# Patient Record
Sex: Female | Born: 1965
Health system: Southern US, Community
[De-identification: ages and names within clinical notes are randomized; demographics above are authoritative.]

## PROBLEM LIST (undated history)

## (undated) DIAGNOSIS — R000 Tachycardia, unspecified: Secondary | ICD-10-CM

## (undated) DIAGNOSIS — F419 Anxiety disorder, unspecified: Secondary | ICD-10-CM

## (undated) DIAGNOSIS — D509 Iron deficiency anemia, unspecified: Secondary | ICD-10-CM

## (undated) DIAGNOSIS — D219 Benign neoplasm of connective and other soft tissue, unspecified: Secondary | ICD-10-CM

## (undated) DIAGNOSIS — R030 Elevated blood-pressure reading, without diagnosis of hypertension: Secondary | ICD-10-CM

## (undated) DIAGNOSIS — N920 Excessive and frequent menstruation with regular cycle: Secondary | ICD-10-CM

## (undated) DIAGNOSIS — I10 Essential (primary) hypertension: Secondary | ICD-10-CM

## (undated) DIAGNOSIS — E669 Obesity, unspecified: Secondary | ICD-10-CM

## (undated) DIAGNOSIS — IMO0001 Reserved for inherently not codable concepts without codable children: Secondary | ICD-10-CM

## (undated) DIAGNOSIS — L709 Acne, unspecified: Secondary | ICD-10-CM

## (undated) HISTORY — DX: Acne, unspecified: L70.9

## (undated) HISTORY — DX: Elevated blood-pressure reading, without diagnosis of hypertension: R03.0

## (undated) HISTORY — DX: Anxiety disorder, unspecified: F41.9

## (undated) HISTORY — DX: Reserved for inherently not codable concepts without codable children: IMO0001

## (undated) HISTORY — DX: Obesity, unspecified: E66.9

## (undated) HISTORY — DX: Essential (primary) hypertension: I10

## (undated) HISTORY — DX: Tachycardia, unspecified: R00.0

## (undated) HISTORY — PX: MYOMECTOMY: SHX85

## (undated) HISTORY — PX: OVARIAN CYST REMOVAL: SHX89

## (undated) HISTORY — DX: Benign neoplasm of connective and other soft tissue, unspecified: D21.9

## (undated) HISTORY — DX: Iron deficiency anemia, unspecified: D50.9

## (undated) HISTORY — DX: Excessive and frequent menstruation with regular cycle: N92.0

## (undated) HISTORY — PX: APPENDECTOMY: SHX54

---

## 2011-09-14 ENCOUNTER — Telehealth: Payer: Self-pay | Admitting: Family Medicine

## 2011-09-14 NOTE — Telephone Encounter (Signed)
dt ?

## 2011-09-28 ENCOUNTER — Other Ambulatory Visit (HOSPITAL_COMMUNITY)
Admission: RE | Admit: 2011-09-28 | Discharge: 2011-09-28 | Disposition: A | Discharge status: Home / Self Care | Payer: PRIVATE HEALTH INSURANCE | Source: Ambulatory Visit | Attending: Obstetrics and Gynecology | Admitting: Obstetrics and Gynecology

## 2011-09-28 ENCOUNTER — Other Ambulatory Visit: Payer: Self-pay | Admitting: Obstetrics and Gynecology

## 2011-09-28 DIAGNOSIS — Z01419 Encounter for gynecological examination (general) (routine) without abnormal findings: Secondary | ICD-10-CM | POA: Insufficient documentation

## 2011-09-28 DIAGNOSIS — N760 Acute vaginitis: Secondary | ICD-10-CM | POA: Insufficient documentation

## 2011-09-28 DIAGNOSIS — Z1159 Encounter for screening for other viral diseases: Secondary | ICD-10-CM | POA: Insufficient documentation

## 2011-09-28 DIAGNOSIS — Z1231 Encounter for screening mammogram for malignant neoplasm of breast: Secondary | ICD-10-CM

## 2011-09-29 ENCOUNTER — Telehealth: Payer: Self-pay | Admitting: *Deleted

## 2011-09-29 NOTE — Telephone Encounter (Signed)
Called and spoke with patient, Dr Cain Saupe in regards to scheduling her consultation for IV iron/anemia.  She has started oral iron and would like to try to get Wednesday afternoon appointments. Orders sent to scheduling.

## 2011-10-05 ENCOUNTER — Telehealth: Payer: Self-pay | Admitting: Oncology

## 2011-10-05 NOTE — Telephone Encounter (Signed)
Talked to pt, she is aware of appt and location 

## 2011-10-05 NOTE — Telephone Encounter (Signed)
Called pt, left message on cell regarding appt as a new patient, waiting for a call back

## 2011-10-10 ENCOUNTER — Telehealth: Payer: Self-pay | Admitting: Oncology

## 2011-10-10 NOTE — Telephone Encounter (Signed)
Referred by Dr. Geryl Rankins Dx- IDA

## 2011-10-18 ENCOUNTER — Encounter: Payer: Self-pay | Admitting: Oncology

## 2011-10-18 ENCOUNTER — Other Ambulatory Visit: Payer: Self-pay | Admitting: Oncology

## 2011-10-18 DIAGNOSIS — D509 Iron deficiency anemia, unspecified: Secondary | ICD-10-CM

## 2011-10-18 DIAGNOSIS — N920 Excessive and frequent menstruation with regular cycle: Secondary | ICD-10-CM

## 2011-10-18 DIAGNOSIS — I10 Essential (primary) hypertension: Secondary | ICD-10-CM

## 2011-10-18 HISTORY — DX: Iron deficiency anemia, unspecified: D50.9

## 2011-10-18 HISTORY — DX: Excessive and frequent menstruation with regular cycle: N92.0

## 2011-10-18 HISTORY — DX: Essential (primary) hypertension: I10

## 2011-10-19 ENCOUNTER — Ambulatory Visit (HOSPITAL_BASED_OUTPATIENT_CLINIC_OR_DEPARTMENT_OTHER): Payer: PRIVATE HEALTH INSURANCE | Admitting: Oncology

## 2011-10-19 ENCOUNTER — Ambulatory Visit: Payer: PRIVATE HEALTH INSURANCE

## 2011-10-19 ENCOUNTER — Other Ambulatory Visit (HOSPITAL_BASED_OUTPATIENT_CLINIC_OR_DEPARTMENT_OTHER): Payer: PRIVATE HEALTH INSURANCE | Admitting: Lab

## 2011-10-19 VITALS — BP 151/78 | HR 106 | Temp 97.3°F | Ht 66.0 in | Wt 292.0 lb

## 2011-10-19 DIAGNOSIS — N920 Excessive and frequent menstruation with regular cycle: Secondary | ICD-10-CM

## 2011-10-19 DIAGNOSIS — N924 Excessive bleeding in the premenopausal period: Secondary | ICD-10-CM

## 2011-10-19 DIAGNOSIS — D509 Iron deficiency anemia, unspecified: Secondary | ICD-10-CM

## 2011-10-19 DIAGNOSIS — D500 Iron deficiency anemia secondary to blood loss (chronic): Secondary | ICD-10-CM

## 2011-10-19 LAB — CHCC SMEAR

## 2011-10-19 LAB — CBC & DIFF AND RETIC
BASO%: 0.5 % (ref 0.0–2.0)
EOS%: 1.1 % (ref 0.0–7.0)
HCT: 33 % — ABNORMAL LOW (ref 34.8–46.6)
Immature Retic Fract: 20.2 % — ABNORMAL HIGH (ref 1.60–10.00)
MCH: 21.3 pg — ABNORMAL LOW (ref 25.1–34.0)
MCHC: 29.7 g/dL — ABNORMAL LOW (ref 31.5–36.0)
MONO#: 0.5 10*3/uL (ref 0.1–0.9)
NEUT%: 69.6 % (ref 38.4–76.8)
RBC: 4.61 10*6/uL (ref 3.70–5.45)
RDW: 23.1 % — ABNORMAL HIGH (ref 11.2–14.5)
Retic %: 2.64 % — ABNORMAL HIGH (ref 0.70–2.10)
WBC: 9.3 10*3/uL (ref 3.9–10.3)
lymph#: 2.2 10*3/uL (ref 0.9–3.3)

## 2011-10-19 LAB — IRON AND TIBC
TIBC: 453 ug/dL (ref 250–470)
UIBC: 433 ug/dL — ABNORMAL HIGH (ref 125–400)

## 2011-10-19 NOTE — Progress Notes (Signed)
New Patient Hematology-Oncology Evaluation   Debra Mathews [id number] Dec 21, 1965 46 y.o. 10/19/2011  CC: Dr. 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 She has been in overall good health. She has treated hypertension. She has long-standing menorrhagia. She has known uterine fibroids and uterine polyps. She had a polypectomy procedure in the past. She had a recent GYN exam and an endometrial biopsy which were normal except for a fibroid uterus. At time of her recent April 17  evaluation by Dr. Dion Body hemoglobin was 9.1 hematocrit 32 MCV 65.8. She was put back on iron supplements on May 23 with a preparation called integral plus, one daily, as well as Lysteda which is a brand name for tranexamic acid, an anti-fibrinolytic agent to use for up to 5 days at the start of her cycle.  She admits that she has not taken iron on a regular basis in the past. She has become more symptomatic with her anemia since she has gained some weight. She is getting increasing fatigue and dyspnea on mild exertion.  She is considering hormonal suppression of her menses but is concerned with the thrombotic risk.  There is no family history of any bleeding disorder. No history of sickle cell disease.   PMH: Past Medical History  Diagnosis Date  . Iron deficiency anemia 10/18/2011    Hb 9.1 MCV 65.8 08/24/11 hx menorrhagia/fibroids/nl GYN exam  . HTN (hypertension), benign 10/18/2011  . Menorrhagia with regular cycle 10/18/2011  Positive for allergic rhinitis. No asthma. No ulcers or reflux. History of hepatitis A contracted from contaminated food; she has been vaccinated twice for  hepatitis B protection; no history of thyroid trouble. No blood clots. No excessive bleeding after surgical procedures. No  history of seizure or stroke.   Past Surgical History: Exploratory surgery for abdominal pain when she was in her 49s with findings of a ruptured ovarian cyst. Incidental appendectomy done at that time.  Allergies: No allergies to medication  Medications: Loratadine 10 mg daily. In Iron with B complex vitamins 1 daily. Valsartan 80 mg daily Norvasc 5 mg daily Flexeril 5 mg 3 times a day Lexapro 10 mg daily Xanax 0.5 mg when necessary. 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 Healthy daughter age 70. No alcohol or tobacco.  Family History: Parents both still alive. Mother with hypertension. Status post resection of lung cancer. Father with diabetes. 2 brothers who are healthy one with diabetes.  Review of Systems: Constitutional symptoms: See above HEENT: Respiratory: No cough or dyspnea Cardiovascular:  No chest pain or palpitations Gastrointestinal ROS: No change in bowel habit. No hematochezia. Genito-Urinary ROS: See above. No hematuria Hematological and Lymphatic: Musculoskeletal: Neurologic: Dermatologic: Remaining ROS negative.  Physical Exam: Blood pressure 151/78, pulse 106, temperature 97.3 F (36.3 C), temperature source Oral, height 5\' 6"  (1.676 m), weight 292 lb (132.45 kg). Wt Readings from Last 3 Encounters:  10/19/11 292 lb (132.45 kg)    General appearance: Pleasant overweight African American woman Head: Normal Neck: Normal Lymph nodes: No cervical, supraclavicular, or axillary adenopathy Breasts: Not examined Lungs: Clear to auscultation resonant to percussion Heart: Regular rhythm no murmur Abdominal: Soft nontender no mass no organomegaly GU: Not examined. Recent  normal pelvic and rectal by her GYN. See discussion above. Extremities: No edema no calf tenderness. Neurologic: Mental status intact, cranial nerves intact, pupils round reactive to light optic disc sharp vessels  normal, motor strength 5 over 5, reflexes 1+ symmetric, sensation intact to vibration by tuning fork exam over the fingertips Skin: No rash or ecchymoses    Lab Results: Lab Results  Component Value Date   WBC 9.3 10/19/2011   HGB 9.8* 10/19/2011   HCT 33.0* 10/19/2011   MCV 71.6* 10/19/2011   PLT 365 10/19/2011   reticulocyte count 2.6%   iron studies pending   Chemistry   No results found for this basename: NA, K, CL, CO2, BUN, CREATININE, GLU   No results found for this basename: CALCIUM, ALKPHOS, AST, ALT, BILITOT       Review of peripheral blood film: Hypochromic microcytic red cells, 1+ he is a poikilocytosis, no polychromasia, mature neutrophils and lymphocytes, platelets normal in number and morphology    Impression and Plan: Simple iron deficiency anemia due to menorrhagia.  I don't recommend parenteral iron in healthy young adults who tolerate oral iron and don't show signs of malabsorption.  The important consideration is to stay on the oral iron on a regular basis. It typically takes about 6 weeks to show a 1 g rise in hemoglobin. She was just started back on iron at time of her recent may 22nd 2013 OB/GYN visit and is already showing a response with hemoglobin up from 9.1 to 9.8, reticulocyte count 2.6%, and MCV up from 66 to 72. She is encouraged to stay on the oral iron and increase dose to twice a day if tolerated.. My concern with IV iron other than the inconvenience and expense is the potential for anaphylactic reactions while although uncommon, still occur  She has discussed more definitive treatment of her menorrhagia either with endometrial ablation her hysterectomy with her gynecologist and is considering these options.       Levert Feinstein, MD 10/19/2011, 5:09 PM

## 2011-10-26 ENCOUNTER — Encounter: Payer: Self-pay | Admitting: Oncology

## 2011-10-26 ENCOUNTER — Ambulatory Visit
Admission: RE | Admit: 2011-10-26 | Discharge: 2011-10-26 | Disposition: A | Discharge status: Home / Self Care | Payer: PRIVATE HEALTH INSURANCE | Source: Ambulatory Visit | Attending: Obstetrics and Gynecology | Admitting: Obstetrics and Gynecology

## 2011-10-26 DIAGNOSIS — Z1231 Encounter for screening mammogram for malignant neoplasm of breast: Secondary | ICD-10-CM

## 2013-09-02 ENCOUNTER — Other Ambulatory Visit: Payer: Self-pay

## 2013-09-02 DIAGNOSIS — Z1231 Encounter for screening mammogram for malignant neoplasm of breast: Secondary | ICD-10-CM

## 2013-09-11 ENCOUNTER — Ambulatory Visit: Payer: PRIVATE HEALTH INSURANCE

## 2013-09-25 ENCOUNTER — Ambulatory Visit
Admission: RE | Admit: 2013-09-25 | Discharge: 2013-09-25 | Disposition: A | Discharge status: Home / Self Care | Payer: BC Managed Care – PPO | Source: Ambulatory Visit

## 2013-09-25 ENCOUNTER — Encounter (INDEPENDENT_AMBULATORY_CARE_PROVIDER_SITE_OTHER): Payer: Self-pay

## 2013-09-25 DIAGNOSIS — Z1231 Encounter for screening mammogram for malignant neoplasm of breast: Secondary | ICD-10-CM

## 2013-12-18 ENCOUNTER — Encounter: Payer: Self-pay | Admitting: *Deleted

## 2014-04-22 ENCOUNTER — Ambulatory Visit
Admission: RE | Admit: 2014-04-22 | Discharge: 2014-04-22 | Disposition: A | Discharge status: Home / Self Care | Payer: BC Managed Care – PPO | Source: Ambulatory Visit | Attending: Rheumatology | Admitting: Rheumatology

## 2014-04-22 ENCOUNTER — Other Ambulatory Visit: Payer: Self-pay | Admitting: Rheumatology

## 2014-04-22 DIAGNOSIS — R700 Elevated erythrocyte sedimentation rate: Secondary | ICD-10-CM

## 2015-12-14 ENCOUNTER — Other Ambulatory Visit: Payer: Self-pay | Admitting: Family Medicine

## 2015-12-14 DIAGNOSIS — R1011 Right upper quadrant pain: Secondary | ICD-10-CM

## 2015-12-23 ENCOUNTER — Inpatient Hospital Stay: Admission: RE | Admit: 2015-12-23 | Payer: Self-pay | Source: Ambulatory Visit

## 2016-03-04 ENCOUNTER — Telehealth: Payer: Self-pay | Admitting: Radiology

## 2016-03-04 ENCOUNTER — Other Ambulatory Visit: Payer: Self-pay | Admitting: Radiology

## 2016-03-04 MED ORDER — SULFASALAZINE 500 MG PO TBEC: 1000.0000 mg | DELAYED_RELEASE_TABLET | Freq: Two times a day (BID) | ORAL | 1 refills | Status: DC

## 2016-03-04 NOTE — Telephone Encounter (Signed)
Called patient to schedule December follow up and patient states she will call back to schedule because she did not know of a good time to come in, as of right now.

## 2016-03-04 NOTE — Telephone Encounter (Signed)
Patient needs follow up appt with Dr Estanislado Pandy pls call to make appt. Dec

## 2016-03-04 NOTE — Telephone Encounter (Signed)
11/18/15 last visit  Next visit not scheduled yet, have sent message to have her scheduled 12/16/15 labs WNL

## 2016-05-09 HISTORY — PX: ROOT CANAL: SHX2363

## 2016-07-12 DIAGNOSIS — E66813 Obesity, class 3: Secondary | ICD-10-CM

## 2016-07-12 HISTORY — DX: Morbid (severe) obesity due to excess calories: E66.01

## 2016-07-12 HISTORY — DX: Obesity, class 3: E66.813

## 2016-08-16 ENCOUNTER — Encounter: Payer: Self-pay | Admitting: Gastroenterology

## 2016-10-05 ENCOUNTER — Encounter: Payer: Self-pay | Admitting: Family Medicine

## 2016-10-18 ENCOUNTER — Encounter: Payer: Self-pay | Admitting: Gastroenterology

## 2017-04-27 NOTE — Progress Notes (Signed)
Office Visit Note  Patient: Debra Mathews             Date of Birth: 09-11-1965           MRN: [id number]             PCP: Maurice Small, MD Referring: No ref. provider found Visit Date: 05/03/2017 Occupation: @GUAROCC @    Subjective:  Other (Bilateral hand pain and right SI joint pain )   History of Present Illness: Debra Mathews is a 51 y.o. female with history of seronegative inflammatory arthritis. She returns today after her last visit in July 2017. At the time she came off sulfasalazine and was having a flare. She was given a prednisone taper which she states was effective. She did not restart on sulfasalazine. She's been having intermittent pain and discomfort in her joints. She describes pain in her bilateral hands especially in her bilateral third finger. She continues to have pain and discomfort in her bilateral knee joints especially with the weather change. She also had old fracture in her left ankle which bothers her at times. She also has dorsal spurs in her feet which is painful. She has noticed some swelling in her hands. She also reports discomfort in her right SI joint. She states her back gets stiff after prolonged sitting.  Activities of Daily Living:  Patient reports morning stiffness for 0 minute.   Patient Denies nocturnal pain.  Difficulty dressing/grooming: Denies Difficulty climbing stairs: Reports Difficulty getting out of chair: Reports Difficulty using hands for taps, buttons, cutlery, and/or writing: Reports   Review of Systems  Constitutional: Positive for fatigue. Negative for night sweats, weight gain, weight loss and weakness.  HENT: Negative for mouth sores, trouble swallowing, trouble swallowing, mouth dryness and nose dryness.   Eyes: Negative for pain, redness, visual disturbance and dryness.  Respiratory: Positive for shortness of breath. Negative for cough and difficulty breathing.        With exertion only  Cardiovascular: Positive for  hypertension. Negative for chest pain, palpitations, irregular heartbeat and swelling in legs/feet.  Gastrointestinal: Negative for blood in stool and diarrhea.  Endocrine: Negative for increased urination.  Genitourinary: Negative for vaginal dryness.  Musculoskeletal: Positive for arthralgias, joint pain and joint swelling. Negative for myalgias, muscle weakness, morning stiffness, muscle tenderness and myalgias.  Skin: Positive for color change. Negative for rash, hair loss, skin tightness, ulcers and sensitivity to sunlight.  Allergic/Immunologic: Negative for susceptible to infections.  Neurological: Negative for dizziness, memory loss and night sweats.  Hematological: Negative for swollen glands.  Psychiatric/Behavioral: Positive for sleep disturbance. Negative for depressed mood. The patient is not nervous/anxious.     PMFS History:  Patient Active Problem List   Diagnosis Date Noted  . Iron deficiency anemia 10/18/2011  . HTN (hypertension), benign 10/18/2011  . Menorrhagia with regular cycle 10/18/2011    Past Medical History:  Diagnosis Date  . Acne    adult  . Anxiety   . Elevated BP    no dx of HTN  . Fibroids   . HTN (hypertension), benign 10/18/2011  . Iron deficiency anemia 10/18/2011   Hb 9.1 MCV 65.8 08/24/11 hx menorrhagia/fibroids/nl GYN exam  . Menorrhagia with regular cycle 10/18/2011  . Obesity   . Sinus tachycardia     Family History  Problem Relation Age of Onset  . Osteoarthritis Mother   . COPD Mother   . Diabetes Mother   . Hypertension Mother   . Cancer - Lung  Mother   . Cancer - Colon Mother   . Cancer Mother        breast   . Osteoarthritis Father   . Diabetes Brother   . Hypertension Brother   . Raynaud syndrome Daughter    Past Surgical History:  Procedure Laterality Date  . APPENDECTOMY    . MYOMECTOMY    . OVARIAN CYST REMOVAL    . ROOT CANAL  2018   Social History   Social History Narrative  . Not on file      Objective: Vital Signs: BP 128/74 (BP Location: Left Arm, Patient Position: Sitting, Cuff Size: Normal)   Pulse 76   Resp 18   Ht 5\' 6"  (1.676 m)   Wt 300 lb (136.1 kg)   BMI 48.42 kg/m    Physical Exam  Constitutional: She is oriented to person, place, and time. She appears well-developed and well-nourished.  HENT:  Head: Normocephalic and atraumatic.  Eyes: Conjunctivae and EOM are normal.  Neck: Normal range of motion.  Cardiovascular: Normal rate, regular rhythm, normal heart sounds and intact distal pulses.  Pulmonary/Chest: Effort normal and breath sounds normal.  Abdominal: Soft. Bowel sounds are normal.  Lymphadenopathy:    She has no cervical adenopathy.  Neurological: She is alert and oriented to person, place, and time.  Skin: Skin is warm and dry. Capillary refill takes less than 2 seconds.  Psychiatric: She has a normal mood and affect. Her behavior is normal.  Nursing note and vitals reviewed.    Musculoskeletal Exam: C-spine and thoracic lumbar spine good range of motion. Shoulder joints elbow joints wrist joints are good range of motion. She had mild tenderness in her right second and left second MCP joint. No synovitis was noted. She also has some DIP changes consistent with osteoarthritis but no synovitis was noted. Hip joints, knee joints, ankles, MTPs PIPs with good range of motion with no synovitis or warmth.  CDAI Exam: CDAI Homunculus Exam:   Tenderness:  Right hand: 2nd MCP Left hand: 2nd MCP LLE: tibiotalar  Joint Counts:  CDAI Tender Joint count: 2 CDAI Swollen Joint count: 0  Global Assessments:  Patient Global Assessment: 4 Provider Global Assessment: 2  CDAI Calculated Score: 8    Investigation: No additional findings.   Imaging: Xr Hand 2 View Left  Result Date: 05/03/2017 No MCP or DIP intercarpal radiocarpal or metacarpocarpal joint space narrowing was noted. Mild PIP narrowing was noted. No erosive changes were noted.  Mild juxta-articular osteopenia was noted.  Xr Hand 2 View Right  Result Date: 05/03/2017 No MCP or DIP intercarpal radiocarpal or metacarpocarpal joint space narrowing was noted. Mild PIP narrowing was noted. No erosive changes were noted. Mild juxta-articular osteopenia was noted.  Xr Knee 3 View Left  Result Date: 05/03/2017 Moderate-to-severe medial compartment narrowing with intercondylar osteophytes. No chondrocalcinosis was noted. Moderate to severe patellofemoral narrowing was noted. Impression: Moderate to severe osteoarthritis and chondromalacia patella involving left knee.  Xr Knee 3 View Right  Result Date: 05/03/2017 Moderate medial compartment narrowing with intercondylar osteophytes. No chondrocalcinosis was noted. Moderate to severe patellofemoral narrowing was noted. Tibial tuberosity prominence and changes consistent with Osgood-Schlatter syndrome was noted. Impression: Moderate osteoarthritis and moderate to severe chondromalacia patella.   Speciality Comments: No specialty comments available.    Procedures:  No procedures performed Allergies: Patient has no known allergies.   Assessment / Plan:     Visit Diagnoses: Seronegative arthritis - inflammatory, positive synovitis on [place] in the past.  She does not have any synovitis or inflammation exam today. Although she gives history of intermittent swelling in her hands. She tried sulfasalazine for few months initially and then stopped it. She's been off any DMARD's for more than one year.  Bilateral hand pain - Plan: XR Hand 2 View Right, XR Hand 2 View Left. X-rays revealed only mild osteoarthritic changes. No erosive changes were noted.  Chronic pain of both knees - Plan: XR KNEE 3 VIEW RIGHT, XR KNEE 3 VIEW LEFT. X-ray showed bilateral moderate osteoarthritis and moderate chondromalacia patella. She also has right knee Osgood-Schlatter disease. Her knee joints haven't remittent pain. She has had cortisone injection in  the past which she failed. I discussed the option of viscous supplement injections. She would like to wait at this point. Weight loss diet and exercise was discussed. We also discussed the use of natural anti-inflammatories and diclofenac gel. Per her request a prescription for diclofenac gel was given.  Primary osteoarthritis of both knees  Primary osteoarthritis of both feet: Proper fitting shoes were discussed.  DDD (degenerative disc disease), lumbar: She gets some stiffness after prolonged sitting.  History of vitamin D deficiency: She's been taking supplement.  History of hypertension: Her blood pressure appears to be well controlled.  Other medical problems are listed as follows:  History of anxiety  History of allergy  History of anemia  Vitamin B12 deficiency - H/O  History of appendectomy  Family history of systemic lupus erythematosus    Orders: Orders Placed This Encounter  Procedures  . XR Hand 2 View Right  . XR Hand 2 View Left  . XR KNEE 3 VIEW RIGHT  . XR KNEE 3 VIEW LEFT   Meds ordered this encounter  Medications  . diclofenac sodium (VOLTAREN) 1 % GEL    Sig: Apply 3 gm to 3 large joints up to 3 times a day.Dispense 3 tubes with 3 refills.    Dispense:  5 Tube    Refill:  1    Face-to-face time spent with patient was 30 minutes. 50% of time was spent in counseling and coordination of care.  Follow-Up Instructions: Return if symptoms worsen or fail to improve.   Bo Merino, MD  Note - This record has been created using Editor, commissioning.  Chart creation errors have been sought, but may not always  have been located. Such creation errors do not reflect on  the standard of medical care.

## 2017-05-03 ENCOUNTER — Ambulatory Visit (INDEPENDENT_AMBULATORY_CARE_PROVIDER_SITE_OTHER): Payer: Self-pay

## 2017-05-03 ENCOUNTER — Ambulatory Visit (INDEPENDENT_AMBULATORY_CARE_PROVIDER_SITE_OTHER): Payer: 59

## 2017-05-03 ENCOUNTER — Encounter: Payer: Self-pay | Admitting: Rheumatology

## 2017-05-03 ENCOUNTER — Ambulatory Visit (INDEPENDENT_AMBULATORY_CARE_PROVIDER_SITE_OTHER): Payer: 59 | Admitting: Rheumatology

## 2017-05-03 VITALS — BP 128/74 | HR 76 | Resp 18 | Ht 66.0 in | Wt 300.0 lb

## 2017-05-03 DIAGNOSIS — M25562 Pain in left knee: Secondary | ICD-10-CM

## 2017-05-03 DIAGNOSIS — M25561 Pain in right knee: Secondary | ICD-10-CM

## 2017-05-03 DIAGNOSIS — M79641 Pain in right hand: Secondary | ICD-10-CM

## 2017-05-03 DIAGNOSIS — M170 Bilateral primary osteoarthritis of knee: Secondary | ICD-10-CM

## 2017-05-03 DIAGNOSIS — Z8269 Family history of other diseases of the musculoskeletal system and connective tissue: Secondary | ICD-10-CM

## 2017-05-03 DIAGNOSIS — E538 Deficiency of other specified B group vitamins: Secondary | ICD-10-CM | POA: Diagnosis not present

## 2017-05-03 DIAGNOSIS — M79642 Pain in left hand: Secondary | ICD-10-CM | POA: Diagnosis not present

## 2017-05-03 DIAGNOSIS — M19071 Primary osteoarthritis, right ankle and foot: Secondary | ICD-10-CM

## 2017-05-03 DIAGNOSIS — Z8679 Personal history of other diseases of the circulatory system: Secondary | ICD-10-CM

## 2017-05-03 DIAGNOSIS — M1380 Other specified arthritis, unspecified site: Secondary | ICD-10-CM

## 2017-05-03 DIAGNOSIS — M5136 Other intervertebral disc degeneration, lumbar region: Secondary | ICD-10-CM

## 2017-05-03 DIAGNOSIS — M19072 Primary osteoarthritis, left ankle and foot: Secondary | ICD-10-CM

## 2017-05-03 DIAGNOSIS — Z889 Allergy status to unspecified drugs, medicaments and biological substances status: Secondary | ICD-10-CM

## 2017-05-03 DIAGNOSIS — Z8659 Personal history of other mental and behavioral disorders: Secondary | ICD-10-CM

## 2017-05-03 DIAGNOSIS — Z862 Personal history of diseases of the blood and blood-forming organs and certain disorders involving the immune mechanism: Secondary | ICD-10-CM | POA: Diagnosis not present

## 2017-05-03 DIAGNOSIS — Z9049 Acquired absence of other specified parts of digestive tract: Secondary | ICD-10-CM

## 2017-05-03 DIAGNOSIS — Z8639 Personal history of other endocrine, nutritional and metabolic disease: Secondary | ICD-10-CM

## 2017-05-03 DIAGNOSIS — G8929 Other chronic pain: Secondary | ICD-10-CM

## 2017-05-03 MED ORDER — DICLOFENAC SODIUM 1 % TD GEL: TRANSDERMAL | 1 refills | Status: DC

## 2017-05-03 NOTE — Patient Instructions (Signed)
Natural anti-inflammatories  You can purchase these at [employer], [employer] or online.  . Turmeric Supreme (capsules)  . Ginger (ginger root or capsules)  . Omega 3 (Fish, flax seeds, chia seeds, walnuts, almonds)  . Tart cherry (dried or extract or tablets)   Patient should be under the care of a physician while taking these supplements. This may not be reproduced without the permission of Dr. Bo Merino.

## 2017-05-11 ENCOUNTER — Ambulatory Visit: Payer: 59 | Admitting: Rheumatology

## 2017-07-05 ENCOUNTER — Other Ambulatory Visit: Payer: Self-pay | Admitting: Family Medicine

## 2017-07-05 DIAGNOSIS — Z139 Encounter for screening, unspecified: Secondary | ICD-10-CM

## 2017-07-26 ENCOUNTER — Ambulatory Visit
Admission: RE | Admit: 2017-07-26 | Discharge: 2017-07-26 | Disposition: A | Discharge status: Home / Self Care | Payer: PRIVATE HEALTH INSURANCE | Source: Ambulatory Visit | Attending: Family Medicine | Admitting: Family Medicine

## 2017-07-26 DIAGNOSIS — Z139 Encounter for screening, unspecified: Secondary | ICD-10-CM

## 2017-09-14 ENCOUNTER — Ambulatory Visit: Payer: Self-pay | Admitting: Nurse Practitioner

## 2017-09-14 VITALS — BP 115/85 | HR 92 | Temp 97.9°F | Resp 16 | Wt 294.0 lb

## 2017-09-14 DIAGNOSIS — S46911A Strain of unspecified muscle, fascia and tendon at shoulder and upper arm level, right arm, initial encounter: Secondary | ICD-10-CM

## 2017-09-14 MED ORDER — IBUPROFEN 800 MG PO TABS: 800.0000 mg | ORAL_TABLET | Freq: Three times a day (TID) | ORAL | 0 refills | Status: AC | PRN

## 2017-09-14 MED ORDER — PREDNISONE 10 MG (21) PO TBPK: ORAL_TABLET | ORAL | 0 refills | Status: AC

## 2017-09-14 MED ORDER — CYCLOBENZAPRINE HCL 5 MG PO TABS: 5.0000 mg | ORAL_TABLET | Freq: Three times a day (TID) | ORAL | 0 refills | Status: AC | PRN

## 2017-09-14 MED ORDER — CEPHALEXIN 500 MG PO CAPS: 500.0000 mg | ORAL_CAPSULE | Freq: Three times a day (TID) | ORAL | 0 refills | Status: AC

## 2017-09-14 NOTE — Patient Instructions (Signed)
Muscle Strain A muscle strain is an injury that occurs when a muscle is stretched beyond its normal length. Usually a small number of muscle fibers are torn when this happens. Muscle strain is rated in degrees. First-degree strains have the least amount of muscle fiber tearing and pain. Second-degree and third-degree strains have increasingly more tearing and pain. Usually, recovery from muscle strain takes 1-2 weeks. Complete healing takes 5-6 weeks. What are the causes? Muscle strain happens when a sudden, violent force placed on a muscle stretches it too far. This may occur with lifting, sports, or a fall. What increases the risk? Muscle strain is especially common in athletes. What are the signs or symptoms? At the site of the muscle strain, there may be:  Pain.  Bruising.  Swelling.  Difficulty using the muscle due to pain or lack of normal function.  How is this diagnosed? Your health care provider will perform a physical exam and ask about your medical history. How is this treated? Often, the best treatment for a muscle strain is resting, icing, and applying cold compresses to the injured area. Follow these instructions at home:  Use the PRICE method of treatment to promote muscle healing during the first 2-3 days after your injury. The PRICE method involves: ? Protecting the muscle from being injured again. ? Restricting your activity and resting the injured body part. ? Icing your injury. To do this, put ice in a plastic bag. Place a towel between your skin and the bag. Then, apply the ice and leave it on from 15-20 minutes each hour. After the third day, switch to moist heat packs. ? Apply compression to the injured area with a splint or elastic bandage. Be careful not to wrap it too tightly. This may interfere with blood circulation or increase swelling. ? Elevate the injured body part above the level of your heart as often as you can.  Only take over-the-counter or  prescription medicines for pain, discomfort, or fever as directed by your health care provider.  Warming up prior to exercise helps to prevent future muscle strains. Contact a health care provider if:  You have increasing pain or swelling in the injured area.  You have numbness, tingling, or a significant loss of strength in the injured area. This information is not intended to replace advice given to you by your health care provider. Make sure you discuss any questions you have with your health care provider. Document Released: 04/25/2005 Document Revised: 10/01/2015 Document Reviewed: 11/22/2012 Elsevier Interactive Patient Education  2017 [employer].  Shoulder Pain Many things can cause shoulder pain, including:  An injury to the area.  Overuse of the shoulder.  Arthritis.  The source of the pain can be:  Inflammation.  An injury to the shoulder joint.  An injury to a tendon, ligament, or bone.  Follow these instructions at home: Take these actions to help with your pain:  Squeeze a soft ball or a foam pad as much as possible. This helps to keep the shoulder from swelling. It also helps to strengthen the arm.  Take over-the-counter and prescription medicines only as told by your health care provider.  If directed, apply ice to the area: ? Put ice in a plastic bag. ? Place a towel between your skin and the bag. ? Leave the ice on for 20 minutes, 2-3 times per day. Stop applying ice if it does not help with the pain.  If you were given a shoulder sling or immobilizer: ?  Wear it as told. ? Remove it to shower or bathe. ? Move your arm as little as possible, but keep your hand moving to prevent swelling.  Contact a health care provider if:  Your pain gets worse.  Your pain is not relieved with medicines.  New pain develops in your arm, hand, or fingers. Get help right away if:  Your arm, hand, or fingers: ? Tingle. ? Become numb. ? Become swollen. ? Become  painful. ? Turn white or blue. This information is not intended to replace advice given to you by your health care provider. Make sure you discuss any questions you have with your health care provider. Document Released: 02/02/2005 Document Revised: 12/20/2015 Document Reviewed: 08/18/2014 Elsevier Interactive Patient Education  2018 [employer].  Shoulder Range of Motion Exercises Shoulder range of motion (ROM) exercises are designed to keep the shoulder moving freely. They are often recommended for people who have shoulder pain. Phase 1 exercises When you are able, do this exercise 5-6 days per week, or as told by your health care provider. Work toward doing 2 sets of 10 swings. Pendulum Exercise How To Do This Exercise Lying Down 1. Lie face-down on a bed with your abdomen close to the side of the bed. 2. Let your arm hang over the side of the bed. 3. Relax your shoulder, arm, and hand. 4. Slowly and gently swing your arm forward and back. Do not use your neck muscles to swing your arm. They should be relaxed. If you are struggling to swing your arm, have someone gently swing it for you. When you do this exercise for the first time, swing your arm at a 15 degree angle for 15 seconds, or swing your arm 10 times. As pain lessens over time, increase the angle of the swing to 30-45 degrees. 5. Repeat steps 1-4 with the other arm.  How To Do This Exercise While Standing 1. Stand next to a sturdy chair or table and hold on to it with your hand. 1. Bend forward at the waist. 2. Bend your knees slightly. 3. Relax your other arm and let it hang limp. 4. Relax the shoulder blade of the arm that is hanging and let it drop. 5. While keeping your shoulder relaxed, use body motion to swing your arm in small circles. The first time you do this exercise, swing your arm for about 30 seconds or 10 times. When you do it next time, swing your arm for a little longer. 6. Stand up tall and relax. 7. Repeat  steps 1-7, this time changing the direction of the circles. 2. Repeat steps 1-8 with the other arm.  Phase 2 exercises Do these exercises 3-4 times per day on 5-6 days per week or as told by your health care provider. Work toward holding the stretch for 20 seconds. Stretching Exercise 1 1. Lift your arm straight out in front of you. 2. Bend your arm 90 degrees at the elbow (right angle) so your forearm goes across your body and looks like the letter "L." 3. Use your other arm to gently pull the elbow forward and across your body. 4. Repeat steps 1-3 with the other arm. Stretching Exercise 2 You will need a towel or rope for this exercise. 1. Bend one arm behind your back with the palm facing outward. 2. Hold a towel with your other hand. 3. Reach the arm that holds the towel above your head, and bend that arm at the elbow. Your wrist  should be behind your neck. 4. Use your free hand to grab the free end of the towel. 5. With the higher hand, gently pull the towel up behind you. 6. With the lower hand, pull the towel down behind you. 7. Repeat steps 1-6 with the other arm.  Phase 3 exercises Do each of these exercises at four different times of day (sessions) every day or as told by your health care provider. To begin with, repeat each exercise 5 times (repetitions). Work toward doing 3 sets of 12 repetitions or as told by your health care provider. Strengthening Exercise 1 You will need a light weight for this activity. As you grow stronger, you may use a heavier weight. 1. Standing with a weight in your hand, lift your arm straight out to the side until it is at the same height as your shoulder. 2. Bend your arm at 90 degrees so that your fingers are pointing to the ceiling. 3. Slowly raise your hand until your arm is straight up in the air. 4. Repeat steps 1-3 with the other arm.  Strengthening Exercise 2 You will need a light weight for this activity. As you grow stronger, you may  use a heavier weight. 1. Standing with a weight in your hand, gradually move your straight arm in an arc, starting at your side, then out in front of you, then straight up over your head. 2. Gradually move your other arm in an arc, starting at your side, then out in front of you, then straight up over your head. 3. Repeat steps 1-2 with the other arm.  Strengthening Exercise 3 You will need an elastic band for this activity. As you grow stronger, gradually increase the size of the bands or increase the number of bands that you use at one time. 1. While standing, hold an elastic band in one hand and raise that arm up in the air. 2. With your other hand, pull down the band until that hand is by your side. 3. Repeat steps 1-2 with the other arm.  This information is not intended to replace advice given to you by your health care provider. Make sure you discuss any questions you have with your health care provider. Document Released: 01/22/2003 Document Revised: 12/20/2015 Document Reviewed: 04/21/2014 Elsevier Interactive Patient Education  [employer].

## 2017-09-14 NOTE — Progress Notes (Signed)
Subjective:    Debra Mathews is a 52 y.o. female who presents with right shoulder pain. The symptoms began 2 hours ago. 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 Pain is located around the acromioclavicular [facility]) joint. Discomfort is described as sharp/stabbing. Symptoms are exacerbated by overhead movements. Evaluation to date: none. Patient placed a diclofenac patch to the right shoulder.    Patient also with reoccurring skin disruption to her left cheek.  No redness or erythema at present, no discharge.  Patient has tried creams, but will not resolve.   The following portions of the patient's history were reviewed and updated as appropriate: allergies, current medications and past medical history.  Review of Systems Constitutional: negative Respiratory: negative Cardiovascular: negative Musculoskeletal:positive for right shoulder pain, negative for back pain, muscle weakness and neck pain Neurological: negative   Objective:    BP 115/85 (BP Location: Left Arm, Patient Position: Sitting, Cuff Size: Large)   Pulse 92   Temp 97.9 F (36.6 C) (Oral)   Resp 16   Wt 294 lb (133.4 kg)   SpO2 98%   BMI 47.45 kg/m  Right shoulder: positive for tenderness over the acromioclavicular joint, sensory exam normal, radial pulse intact and limited ROM and limited passive ROM  Left shoulder: normal active ROM, no tenderness, no impingement sign    Skin:  Papule to left cheek.  No erythema or discharge.  Measures approximately 1cm in diameter.  Assessment:    Right shoulder strain  and Skin Infection  Plan:    Natural history and expected course discussed. Questions answered. Neurosurgeon distributed. Gentle ROM exercises. Rest, ice, compression, and elevation (RICE) therapy. NSAIDs per medication orders. Follow up as needed.    Meds ordered this encounter  Medications  . cephALEXin (KEFLEX) 500 MG capsule    Sig: Take 1 capsule (500 mg total) by mouth 3 (three) times  daily for 14 days.    Dispense:  42 capsule    Refill:  0    Order Specific Question:   Supervising Provider    Answer:   Ricard Dillon [[id number]]  . predniSONE (STERAPRED UNI-PAK 21 TAB) 10 MG (21) TBPK tablet    Sig: Take as directed.    Dispense:  21 tablet    Refill:  0    Order Specific Question:   Supervising Provider    Answer:   Ricard Dillon [[id number]]  . ibuprofen (ADVIL,MOTRIN) 800 MG tablet    Sig: Take 1 tablet (800 mg total) by mouth every 8 (eight) hours as needed for up to 10 days.    Dispense:  30 tablet    Refill:  0    Order Specific Question:   Supervising Provider    Answer:   Ricard Dillon [[id number]]  . cyclobenzaprine (FLEXERIL) 5 MG tablet    Sig: Take 1 tablet (5 mg total) by mouth 3 (three) times daily as needed for up to 7 days for muscle spasms.    Dispense:  21 tablet    Refill:  0    Order Specific Question:   Supervising Provider    Answer:   Ricard Dillon [phone]

## 2017-09-18 ENCOUNTER — Telehealth: Payer: Self-pay

## 2017-09-18 NOTE — Telephone Encounter (Signed)
Patient states feels better.

## 2017-09-21 ENCOUNTER — Ambulatory Visit: Payer: 59 | Admitting: Podiatry

## 2017-09-21 ENCOUNTER — Ambulatory Visit (INDEPENDENT_AMBULATORY_CARE_PROVIDER_SITE_OTHER): Payer: 59

## 2017-09-21 DIAGNOSIS — M25572 Pain in left ankle and joints of left foot: Secondary | ICD-10-CM

## 2017-09-21 DIAGNOSIS — S93402S Sprain of unspecified ligament of left ankle, sequela: Secondary | ICD-10-CM | POA: Diagnosis not present

## 2017-09-21 DIAGNOSIS — S93402A Sprain of unspecified ligament of left ankle, initial encounter: Secondary | ICD-10-CM | POA: Diagnosis not present

## 2017-09-21 DIAGNOSIS — M21619 Bunion of unspecified foot: Secondary | ICD-10-CM | POA: Diagnosis not present

## 2017-09-21 DIAGNOSIS — G8929 Other chronic pain: Secondary | ICD-10-CM | POA: Diagnosis not present

## 2017-09-24 NOTE — Progress Notes (Signed)
Subjective:   Patient ID: Debra Mathews, female   DOB: 52 y.o.   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  She previously had seen another doctor for this and had discussed surgery and she presents today asking for a splint to help pull the toe straight.  She called the office to see if she can just purchase one however she needs to be seen before buying this.  She also has a history of a left ankle fracture and she has been wearing over-the-counter brace which is not been very helpful.  She had to get a more supportive brace.  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  Denies any recent injury.  She has no other concerns.   Review of Systems  All other systems reviewed and are negative.  Past Medical History:  Diagnosis Date  . Acne    adult  . Anxiety   . Elevated BP    no dx of HTN  . Fibroids   . HTN (hypertension), benign 10/18/2011  . Iron deficiency anemia 10/18/2011   Hb 9.1 MCV 65.8 08/24/11 hx menorrhagia/fibroids/nl GYN exam  . Menorrhagia with regular cycle 10/18/2011  . Obesity   . Sinus tachycardia     Past Surgical History:  Procedure Laterality Date  . APPENDECTOMY    . MYOMECTOMY    . OVARIAN CYST REMOVAL    . ROOT CANAL  2018     Current Outpatient Medications:  .  ALPRAZolam (XANAX) 0.5 MG tablet, Take 0.5 mg by mouth at bedtime as needed for anxiety., Disp: , Rfl:  .  amLODipine (NORVASC) 5 MG tablet, Take 5 mg by mouth daily., Disp: , Rfl:  .  buPROPion (WELLBUTRIN XL) 150 MG 24 hr tablet, Take 150 mg by mouth daily., Disp: , Rfl:  .  cephALEXin (KEFLEX) 500 MG capsule, Take 1 capsule (500 mg total) by mouth 3 (three) times daily for 14 days., Disp: 42 capsule, Rfl: 0 .  clotrimazole (GNP CLOTRIMAZOLE 3) 2 % vaginal cream, Place 1 Applicatorful vaginally 2 (two) times daily., Disp: , Rfl:  .  diclofenac (FLECTOR) 1.3 %  PTCH, APPLY 1 PATCH TO SKIN TWICE A DAY EVERY 12 HOURS AS NEEDED, Disp: , Rfl: 5 .  diclofenac sodium (VOLTAREN) 1 % GEL, Apply 3 gm to 3 large joints up to 3 times a day.Dispense 3 tubes with 3 refills., Disp: 5 Tube, Rfl: 1 .  EPINEPHrine 0.3 mg/0.3 mL IJ SOAJ injection, Inject 0.3 mg into the muscle as needed., Disp: , Rfl:  .  escitalopram (LEXAPRO) 10 MG tablet, Take 10 mg by mouth daily., Disp: , Rfl:  .  FeFum-FePoly-FA-B Cmp-C-Biot (INTEGRA PLUS PO), Take 1 capsule by mouth daily., Disp: , Rfl:  .  HYDROcodone-acetaminophen (NORCO/VICODIN) 5-325 MG per tablet, Take 1 tablet by mouth every 6 (six) hours as needed for moderate pain., Disp: , Rfl:  .  ibuprofen (ADVIL,MOTRIN) 800 MG tablet, Take 1 tablet (800 mg total) by mouth every 8 (eight) hours as needed for up to 10 days., Disp: 30 tablet, Rfl: 0 .  levonorgestrel (MIRENA) 20 MCG/24HR IUD, 1 each by Intrauterine route once., Disp: , Rfl:  .  loratadine (CLARITIN) 10 MG tablet, Take 10 mg by mouth as needed., Disp: , Rfl:  .  sulfaSALAzine (AZULFIDINE) 500 MG EC tablet, Take  2 tablets (1,000 mg total) by mouth 2 (two) times daily. Should be DR tablets please (Patient not taking: Reported on 05/03/2017), Disp: 120 tablet, Rfl: 1 .  valsartan (DIOVAN) 160 MG tablet, Take 160 mg by mouth daily., Disp: , Rfl:  .  venlafaxine (EFFEXOR) 75 MG tablet, Take 75 mg by mouth daily., Disp: , Rfl:  .  VYVANSE 70 MG capsule, Take 70 mg by mouth every morning., Disp: , Rfl: 0  No Known Allergies  Social History   Socioeconomic History  . Marital status: Single    Spouse name: Not on file  . Number of children: Not on file  . Years of education: Not on file  . Highest education level: Not on file  Occupational History  . Not on file  Social Needs  . Financial resource strain: Not on file  . Food insecurity:    Worry: Not on file    Inability: Not on file  . Transportation needs:    Medical: Not on file    Non-medical: Not on file  Tobacco  Use  . Smoking status: Never Smoker  . Smokeless tobacco: Never Used  Substance and Sexual Activity  . Alcohol use: No    Frequency: Never  . Drug use: No  . Sexual activity: Not on file  Lifestyle  . Physical activity:    Days per week: Not on file    Minutes per session: Not on file  . Stress: Not on file  Relationships  . Social connections:    Talks on phone: Not on file    Gets together: Not on file    Attends religious service: Not on file    Active member of club or organization: Not on file    Attends meetings of clubs or organizations: Not on file    Relationship status: Not on file  . Intimate partner violence:    Fear of current or ex partner: Not on file    Emotionally abused: Not on file    Physically abused: Not on file    Forced sexual activity: Not on file  Other Topics Concern  . Not on file  Social History Narrative  . Not on file         Objective:  Physical Exam  General: AAO x3, NAD  Dermatological: Skin is warm, dry and supple bilateral. Nails x 10 are well manicured; remaining integument appears unremarkable at this time. There are no open sores, no preulcerative lesions, no rash or signs of infection present.  Vascular: Dorsalis Pedis artery and Posterior Tibial artery pedal pulses are 2/4 bilateral with immedate capillary fill time. Pedal hair growth present. No varicosities and no lower extremity edema present bilateral. There is no pain with calf compression, swelling, warmth, erythema.   Neruologic: Grossly intact via light touch bilateral. Vibratory intact via tuning fork bilateral. Protective threshold with Semmes Wienstein monofilament intact to all pedal sites bilateral.   Musculoskeletal: Significant HAV is present on the right foot.  There is mild tenderness to palpation of the bunion site but there is no other areas of tenderness on the right foot.  First ray hypomobility is present.  On the left foot there is mild tenderness on the  lateral aspect the ankle on the ATFL.  There is no gross ankle instability present.  There is a decrease in medial arch upon weightbearing.  There is no other areas of tenderness and there is no overlying edema, erythema, increase in warmth.  Achilles tendon, plantar  fascial, extensor, flexor tendons appear to be intact bilaterally.  Muscular strength 5/5 in all groups tested bilateral.  Gait: Unassisted, Nonantalgic.       Assessment:   Right foot significant HAV, left ankle pain    Plan:  -Treatment options discussed including all alternatives, risks, and complications -Etiology of symptoms were discussed -X-rays were obtained and reviewed with the patient.  Significant HAV is present.  There is no evidence of acute fracture or stress fracture identified bilaterally.  1. Right foot HAV -She wishes to hold off on surgery at this point we discussed in the future if symptoms continue or worsen she will likely have a Lapidus bunionectomy.  We discussed that surgery as well as the postoperative course.  Dispensed a bunion splint for her today.  Discussed the change in shoes as well.  Also dispensed offloading pads.  2.  Left ankle pain -She is been wearing a over-the-counter brace.  She is been working out with a Clinical research associate and she was in a more supportive brace.  Dispensed a Tri-Lock ankle brace.  This is been a chronic issue for her.  If symptoms continue we can get an MRI if needed.  Trula Slade DPM

## 2017-10-15 ENCOUNTER — Ambulatory Visit (INDEPENDENT_AMBULATORY_CARE_PROVIDER_SITE_OTHER): Payer: Self-pay | Admitting: Nurse Practitioner

## 2017-10-15 VITALS — BP 130/84 | HR 100 | Temp 99.4°F | Resp 20 | Wt 291.2 lb

## 2017-10-15 DIAGNOSIS — J0140 Acute pansinusitis, unspecified: Secondary | ICD-10-CM

## 2017-10-15 MED ORDER — ALBUTEROL SULFATE HFA 108 (90 BASE) MCG/ACT IN AERS: 2.0000 | INHALATION_SPRAY | Freq: Four times a day (QID) | RESPIRATORY_TRACT | 0 refills | Status: DC | PRN

## 2017-10-15 MED ORDER — AMOXICILLIN-POT CLAVULANATE 875-125 MG PO TABS: 1.0000 | ORAL_TABLET | Freq: Two times a day (BID) | ORAL | 0 refills | Status: AC

## 2017-10-15 MED ORDER — BENZONATATE 200 MG PO CAPS: 200.0000 mg | ORAL_CAPSULE | Freq: Three times a day (TID) | ORAL | 0 refills | Status: AC | PRN

## 2017-10-15 MED ORDER — MONTELUKAST SODIUM 10 MG PO TABS: 10.0000 mg | ORAL_TABLET | Freq: Every day | ORAL | 0 refills | Status: DC

## 2017-10-15 NOTE — Patient Instructions (Signed)
Sinusitis, Adult  Sinusitis is soreness and inflammation of your sinuses. Sinuses are hollow spaces in the bones around your face. Your sinuses are located:  Around your eyes.  In the middle of your forehead.  Behind your nose.  In your cheekbones.    Your sinuses and nasal passages are lined with a stringy fluid (mucus). Mucus normally drains out of your sinuses. When your nasal tissues become inflamed or swollen, the mucus can become trapped or blocked so air cannot flow through your sinuses. This allows bacteria, viruses, and funguses to grow, which leads to infection.  Sinusitis can develop quickly and last for 7?10 days (acute) or for more than 12 weeks (chronic). Sinusitis often develops after a cold.  What are the causes?  This condition is caused by anything that creates swelling in the sinuses or stops mucus from draining, including:  Allergies.  Asthma.  Bacterial or viral infection.  Abnormally shaped bones between the nasal passages.  Nasal growths that contain mucus (nasal polyps).  Narrow sinus openings.  Pollutants, such as chemicals or irritants in the air.  A foreign object stuck in the nose.  A fungal infection. This is rare.    What increases the risk?  The following factors may make you more likely to develop this condition:  Having allergies or asthma.  Having had a recent cold or respiratory tract infection.  Having structural deformities or blockages in your nose or sinuses.  Having a weak immune system.  Doing a lot of swimming or diving.  Overusing nasal sprays.  Smoking.    What are the signs or symptoms?  The main symptoms of this condition are pain and a feeling of pressure around the affected sinuses. Other symptoms include:  Upper toothache.  Earache.  Headache.  Bad breath.  Decreased sense of smell and taste.  A cough that may get worse at night.  Fatigue.  Fever.  Thick drainage from your nose. The drainage is often green and it may contain pus (purulent).  Stuffy nose or  congestion.  Postnasal drip. This is when extra mucus collects in the throat or back of the nose.  Swelling and warmth over the affected sinuses.  Sore throat.  Sensitivity to light.    How is this diagnosed?  This condition is diagnosed based on symptoms, a medical history, and a physical exam. To find out if your condition is acute or chronic, your health care provider may:  Look in your nose for signs of nasal polyps.  Tap over the affected sinus to check for signs of infection.  View the inside of your sinuses using an imaging device that has a light attached (endoscope).    If your health care provider suspects that you have chronic sinusitis, you may also:  Be tested for allergies.  Have a sample of mucus taken from your nose (nasal culture) and checked for bacteria.  Have a mucus sample examined to see if your sinusitis is related to an allergy.    If your sinusitis does not respond to treatment and it lasts longer than 8 weeks, you may have an MRI or CT scan to check your sinuses. These scans also help to determine how severe your infection is.  In rare cases, a bone biopsy may be done to rule out more serious types of fungal sinus disease.  How is this treated?  Treatment for sinusitis depends on the cause and whether your condition is chronic or acute. If a virus is causing   your sinusitis, your symptoms will go away on their own within 10 days. You may be given medicines to relieve your symptoms, including:  Topical nasal decongestants. They shrink swollen nasal passages and let mucus drain from your sinuses.  Antihistamines. These drugs block inflammation that is triggered by allergies. This can help to ease swelling in your nose and sinuses.  Topical nasal corticosteroids. These are nasal sprays that ease inflammation and swelling in your nose and sinuses.  Nasal saline washes. These rinses can help to get rid of thick mucus in your nose.    If your condition is caused by bacteria, you will be given an  antibiotic medicine. If your condition is caused by a fungus, you will be given an antifungal medicine.  Surgery may be needed to correct underlying conditions, such as narrow nasal passages. Surgery may also be needed to remove polyps.  Follow these instructions at home:  Medicines  Take, use, or apply over-the-counter and prescription medicines only as told by your health care provider. These may include nasal sprays.  If you were prescribed an antibiotic medicine, take it as told by your health care provider. Do not stop taking the antibiotic even if you start to feel better.  Hydrate and Humidify  Drink enough water to keep your urine clear or pale yellow. Staying hydrated will help to thin your mucus.  Use a cool mist humidifier to keep the humidity level in your home above 50%.  Inhale steam for 10-15 minutes, 3-4 times a day or as told by your health care provider. You can do this in the bathroom while a hot shower is running.  Limit your exposure to cool or dry air.  Rest  Rest as much as possible.  Sleep with your head raised (elevated).  Make sure to get enough sleep each night.  General instructions  Apply a warm, moist washcloth to your face 3-4 times a day or as told by your health care provider. This will help with discomfort.  Wash your hands often with soap and water to reduce your exposure to viruses and other germs. If soap and water are not available, use hand sanitizer.  Do not smoke. Avoid being around people who are smoking (secondhand smoke).  Keep all follow-up visits as told by your health care provider. This is important.  Contact a health care provider if:  You have a fever.  Your symptoms get worse.  Your symptoms do not improve within 10 days.  Get help right away if:  You have a severe headache.  You have persistent vomiting.  You have pain or swelling around your face or eyes.  You have vision problems.  You develop confusion.  Your neck is stiff.  You have trouble breathing.  This  information is not intended to replace advice given to you by your health care provider. Make sure you discuss any questions you have with your health care provider.  Document Released: 04/25/2005 Document Revised: 12/20/2015 Document Reviewed: 02/18/2015  [employer] Interactive Patient Education  2018 [employer]  Acute Bronchitis, Adult  Acute bronchitis is sudden (acute) swelling of the air tubes (bronchi) in the lungs. Acute bronchitis causes these tubes to fill with mucus, which can make it hard to breathe. It can also cause coughing or wheezing.  In adults, acute bronchitis usually goes away within 2 weeks. A cough caused by bronchitis may last up to 3 weeks. Smoking, allergies, and asthma can make the condition worse. Repeated episodes of bronchitis   may cause further lung problems, such as chronic obstructive pulmonary disease (COPD).  What are the causes?  This condition can be caused by germs and by substances that irritate the lungs, including:   Cold and flu viruses. This condition is most often caused by the same virus that causes a cold.   Bacteria.   Exposure to tobacco smoke, dust, fumes, and air pollution.    What increases the risk?  This condition is more likely to develop in people who:   Have close contact with someone with acute bronchitis.   Are exposed to lung irritants, such as tobacco smoke, dust, fumes, and vapors.   Have a weak immune system.   Have a respiratory condition such as asthma.    What are the signs or symptoms?  Symptoms of this condition include:   A cough.   Coughing up clear, yellow, or green mucus.   Wheezing.   Chest congestion.   Shortness of breath.   A fever.   Body aches.   Chills.   A sore throat.    How is this diagnosed?  This condition is usually diagnosed with a physical exam. During the exam, your health care provider may order tests, such as chest X-rays, to rule out other conditions. He or she may also:   Test a sample of your mucus for  bacterial infection.   Check the level of oxygen in your blood. This is done to check for pneumonia.   Do a chest X-ray or lung function testing to rule out pneumonia and other conditions.   Perform blood tests.    Your health care provider will also ask about your symptoms and medical history.  How is this treated?  Most cases of acute bronchitis clear up over time without treatment. Your health care provider may recommend:   Drinking more fluids. Drinking more makes your mucus thinner, which may make it easier to breathe.   Taking a medicine for a fever or cough.   Taking an antibiotic medicine.   Using an inhaler to help improve shortness of breath and to control a cough.   Using a cool mist vaporizer or humidifier to make it easier to breathe.    Follow these instructions at home:  Medicines   Take over-the-counter and prescription medicines only as told by your health care provider.   If you were prescribed an antibiotic, take it as told by your health care provider. Do not stop taking the antibiotic even if you start to feel better.  General instructions   Get plenty of rest.   Drink enough fluids to keep your urine clear or pale yellow.   Avoid smoking and secondhand smoke. Exposure to cigarette smoke or irritating chemicals will make bronchitis worse. If you smoke and you need help quitting, ask your health care provider. Quitting smoking will help your lungs heal faster.   Use an inhaler, cool mist vaporizer, or humidifier as told by your health care provider.   Keep all follow-up visits as told by your health care provider. This is important.  How is this prevented?  To lower your risk of getting this condition again:   Wash your hands often with soap and water. If soap and water are not available, use hand sanitizer.   Avoid contact with people who have cold symptoms.   Try not to touch your hands to your mouth, nose, or eyes.   Make sure to get the flu shot every year.      Contact a  health care provider if:   Your symptoms do not improve in 2 weeks of treatment.  Get help right away if:   You cough up blood.   You have chest pain.   You have severe shortness of breath.   You become dehydrated.   You faint or keep feeling like you are going to faint.   You keep vomiting.   You have a severe headache.   Your fever or chills gets worse.  This information is not intended to replace advice given to you by your health care provider. Make sure you discuss any questions you have with your health care provider.  Document Released: 06/02/2004 Document Revised: 11/18/2015 Document Reviewed: 10/14/2015  [employer] Interactive Patient Education  2018 [employer]

## 2017-10-15 NOTE — Progress Notes (Signed)
Subjective:  Debra Mathews is a 52 y.o. female who presents for evaluation of possible sinusitis.  Symptoms include coryza, facial pain, fever: suspected fevers but not measured at home, lightheadedness, nasal congestion, non productive cough, post nasal drip, sinus pressure and sinus pain.  Onset of symptoms was 5 days ago, and has been rapidly worsening since that time.  Treatment to date:  acetaminophen and antihistamines.  High risk factors for influenza complications:  none.  The following portions of the patient's history were reviewed and updated as appropriate:  allergies, current medications and past medical history.  Constitutional: positive for chills, fatigue, fevers and malaise, negative for anorexia, night sweats, sweats and weight loss Eyes: negative Ears, nose, mouth, throat, and face: positive for nasal congestion and post-nasal drip, scratchy throat, bilateral ear pain/pressure, negative for ear drainage, earaches and sore throat Respiratory: positive for cough, wheezing and pain with coughing, negative for hemoptysis, pneumonia and stridor Cardiovascular: negative Gastrointestinal: negative Neurological: positive for dizziness and lightheadedness, negative for coordination problems, gait problems, seizures, speech problems, tremors, vertigo and weakness Allergic/Immunologic: positive for hay fever Objective:  BP 130/84 (BP Location: Right Arm, Patient Position: Sitting, Cuff Size: Large)   Pulse 100   Temp 99.4 F (37.4 C) (Oral)   Resp 20   Wt 291 lb 3.2 oz (132.1 kg)   SpO2 97%   BMI 47.00 kg/m  General appearance: alert, cooperative, mild distress and due to coughing Head: Normocephalic, without obvious abnormality, atraumatic Eyes: conjunctivae/corneas clear. PERRL, EOM's intact. Fundi benign. Ears: normal TM's and external ear canals both ears Nose: clear discharge, turbinates swollen, inflamed, moderate maxillary sinus tenderness right, moderate frontal sinus  tenderness right Throat: lips, mucosa, and tongue normal; teeth and gums normal Lungs: clear to auscultation bilaterally Heart: regular rate and rhythm, S1, S2 normal, no murmur, click, rub or gallop Abdomen: soft, non-tender; bowel sounds normal; no masses,  no organomegaly Pulses: 2+ and symmetric Skin: Skin color, texture, turgor normal. No rashes or lesions Lymph nodes: cervical and submandibular nodes normal Neurologic: Grossly normal    Assessment:  Acute Pansinusitis and Acute Bronchitis    Plan:  Discussed diagnosis and treatment of sinusitis. Educational material distributed and questions answered. Suggested symptomatic OTC remedies. Supportive care with appropriate antipyretics and fluids. Nasal saline spray for congestion. Augmentin, Albuterol Inhaler, Singulair and Tessalon Perles per orders. Follow up as needed.  Offered patient fluticasone nasal spray, however patient declined at this time.  Instructed patient to use humidifier at home, sleep elevated on 2-3 pillows, and increase fluids.  Patient verbalizes understanding and has no questions at time of discharge. Meds ordered this encounter  Medications  . amoxicillin-clavulanate (AUGMENTIN) 875-125 MG tablet    Sig: Take 1 tablet by mouth 2 (two) times daily for 10 days.    Dispense:  20 tablet    Refill:  0    Order Specific Question:   Supervising Provider    Answer:   Ricard Dillon [[id number]]  . albuterol (PROVENTIL HFA;VENTOLIN HFA) 108 (90 Base) MCG/ACT inhaler    Sig: Inhale 2 puffs into the lungs every 6 (six) hours as needed for up to 10 days for wheezing or shortness of breath.    Dispense:  1 Inhaler    Refill:  0    Order Specific Question:   Supervising Provider    Answer:   Ricard Dillon [[id number]]  . benzonatate (TESSALON) 200 MG capsule    Sig: Take 1 capsule (200 mg total) by mouth  3 (three) times daily as needed for up to 10 days for cough.    Dispense:  30 capsule    Refill:  0    Order Specific  Question:   Supervising Provider    Answer:   Ricard Dillon [[id number]]  . montelukast (SINGULAIR) 10 MG tablet    Sig: Take 1 tablet (10 mg total) by mouth at bedtime.    Dispense:  30 tablet    Refill:  0    Order Specific Question:   Supervising Provider    Answer:   Ricard Dillon [phone]

## 2017-10-28 ENCOUNTER — Emergency Department (HOSPITAL_COMMUNITY)
Admission: EM | Admit: 2017-10-28 | Discharge: 2017-10-28 | Disposition: A | Discharge status: Home / Self Care | Payer: PRIVATE HEALTH INSURANCE | Attending: Emergency Medicine | Admitting: Emergency Medicine

## 2017-10-28 ENCOUNTER — Other Ambulatory Visit: Payer: Self-pay

## 2017-10-28 ENCOUNTER — Emergency Department (HOSPITAL_COMMUNITY): Payer: PRIVATE HEALTH INSURANCE

## 2017-10-28 ENCOUNTER — Encounter (HOSPITAL_COMMUNITY): Payer: Self-pay | Admitting: Emergency Medicine

## 2017-10-28 DIAGNOSIS — R1011 Right upper quadrant pain: Secondary | ICD-10-CM | POA: Diagnosis present

## 2017-10-28 DIAGNOSIS — I10 Essential (primary) hypertension: Secondary | ICD-10-CM | POA: Diagnosis not present

## 2017-10-28 DIAGNOSIS — Z79899 Other long term (current) drug therapy: Secondary | ICD-10-CM | POA: Insufficient documentation

## 2017-10-28 DIAGNOSIS — K8020 Calculus of gallbladder without cholecystitis without obstruction: Secondary | ICD-10-CM

## 2017-10-28 LAB — COMPREHENSIVE METABOLIC PANEL
ALK PHOS: 87 U/L (ref 38–126)
ALT: 34 U/L (ref 14–54)
ANION GAP: 8 (ref 5–15)
AST: 104 U/L — ABNORMAL HIGH (ref 15–41)
Albumin: 3.6 g/dL (ref 3.5–5.0)
BUN: 11 mg/dL (ref 6–20)
CALCIUM: 10.5 mg/dL — AB (ref 8.9–10.3)
CO2: 24 mmol/L (ref 22–32)
Chloride: 107 mmol/L (ref 101–111)
Creatinine, Ser: 1.05 mg/dL — ABNORMAL HIGH (ref 0.44–1.00)
GFR calc non Af Amer: >60 mL/min (ref >60)
Glucose, Bld: 133 mg/dL — ABNORMAL HIGH (ref 65–99)
POTASSIUM: 3.7 mmol/L (ref 3.5–5.1)
Sodium: 139 mmol/L (ref 135–145)
TOTAL PROTEIN: 6.8 g/dL (ref 6.5–8.1)
Total Bilirubin: 1.1 mg/dL (ref 0.3–1.2)

## 2017-10-28 LAB — CBC
HEMATOCRIT: 35.7 % — AB (ref 36.0–46.0)
HEMOGLOBIN: 9.8 g/dL — AB (ref 12.0–15.0)
MCH: 18.7 pg — AB (ref 26.0–34.0)
MCHC: 27.5 g/dL — AB (ref 30.0–36.0)
MCV: 68.1 fL — ABNORMAL LOW (ref 78.0–100.0)
Platelets: 386 10*3/uL (ref 150–400)
RBC: 5.24 MIL/uL — AB (ref 3.87–5.11)
RDW: 18.4 % — ABNORMAL HIGH (ref 11.5–15.5)
WBC: 9.9 10*3/uL (ref 4.0–10.5)

## 2017-10-28 LAB — URINALYSIS, ROUTINE W REFLEX MICROSCOPIC
Bilirubin Urine: NEGATIVE
Glucose, UA: NEGATIVE mg/dL
Hgb urine dipstick: NEGATIVE
Ketones, ur: NEGATIVE mg/dL
Leukocytes, UA: NEGATIVE
Nitrite: NEGATIVE
PH: 7 (ref 5.0–8.0)
Protein, ur: NEGATIVE mg/dL
SPECIFIC GRAVITY, URINE: 1.004 — AB (ref 1.005–1.030)

## 2017-10-28 LAB — I-STAT BETA HCG BLOOD, ED (MC, WL, AP ONLY)

## 2017-10-28 LAB — LIPASE, BLOOD: Lipase: 80 U/L — ABNORMAL HIGH (ref 11–51)

## 2017-10-28 MED ORDER — ONDANSETRON 4 MG PO TBDP: 4.0000 mg | ORAL_TABLET | Freq: Three times a day (TID) | ORAL | 0 refills | Status: DC | PRN

## 2017-10-28 MED ORDER — ONDANSETRON 4 MG PO TBDP
4.0000 mg | ORAL_TABLET | Freq: Once | ORAL | Status: AC
Start: 2017-10-28 — End: 2017-10-28
  Administered 2017-10-28: 4 mg via ORAL
  Filled 2017-10-28: qty 1

## 2017-10-28 NOTE — ED Provider Notes (Signed)
[facility] Provider Note   CSN: [id number] Arrival date & time: 10/28/17  0405     History   Chief Complaint Chief Complaint  Patient presents with  . Abdominal Pain    HPI Debra Mathews is a 52 y.o. female.  52 yo F with a chief complaint of abdominal pain.  This started about an hour prior to arrival.  Right upper quadrant no radiation.  Hard to describe felt that it was sharp.  Had some nausea but denied vomiting.  Subjective fevers and chills with some diaphoresis.  History of a ruptured ovarian cyst.  No other noted abdominal surgery.  The pain resolved just prior to arrival.  The history is provided by the patient.  Abdominal Pain   This is a new problem. The current episode started less than 1 hour ago. The problem occurs rarely. The problem has been resolved. The pain is located in the RUQ. The quality of the pain is sharp and shooting. The pain is at a severity of 8/10. The pain is moderate. Associated symptoms include arthralgias and myalgias. Pertinent negatives include fever, nausea, vomiting, dysuria and headaches. Nothing aggravates the symptoms. Nothing relieves the symptoms.    Past Medical History:  Diagnosis Date  . Acne    adult  . Anxiety   . Elevated BP    no dx of HTN  . Fibroids   . HTN (hypertension), benign 10/18/2011  . Iron deficiency anemia 10/18/2011   Hb 9.1 MCV 65.8 08/24/11 hx menorrhagia/fibroids/nl GYN exam  . Menorrhagia with regular cycle 10/18/2011  . Obesity   . Sinus tachycardia     Patient Active Problem List   Diagnosis Date Noted  . Iron deficiency anemia 10/18/2011  . HTN (hypertension), benign 10/18/2011  . Menorrhagia with regular cycle 10/18/2011    Past Surgical History:  Procedure Laterality Date  . APPENDECTOMY    . MYOMECTOMY    . OVARIAN CYST REMOVAL    . ROOT CANAL  2018     OB History   None      Home Medications    Prior to Admission medications   Medication Sig  Start Date End Date Taking? Authorizing Provider  albuterol (PROVENTIL HFA;VENTOLIN HFA) 108 (90 Base) MCG/ACT inhaler Inhale 2 puffs into the lungs every 6 (six) hours as needed for up to 10 days for wheezing or shortness of breath. 10/15/17 10/28/17 Yes Kara Dies, NP  ALPRAZolam Duanne Moron) 0.5 MG tablet Take 0.5 mg by mouth at bedtime as needed for anxiety.   Yes [provider]  amLODipine (NORVASC) 5 MG tablet Take 5 mg by mouth daily.   Yes [provider]  diclofenac (FLECTOR) 1.3 % PTCH APPLY 1 PATCH TO SKIN TWICE A DAY EVERY 12 HOURS AS NEEDED FOR PAIN 08/24/17  Yes [provider]  loratadine (CLARITIN) 10 MG tablet Take 10 mg by mouth as needed for allergies.    Yes [provider]  valsartan (DIOVAN) 160 MG tablet Take 160 mg by mouth daily.   Yes [provider]  venlafaxine (EFFEXOR) 75 MG tablet Take 75 mg by mouth daily.   Yes [provider]  VYVANSE 70 MG capsule Take 70 mg by mouth every morning. 08/23/17  Yes [provider]  montelukast (SINGULAIR) 10 MG tablet Take 1 tablet (10 mg total) by mouth at bedtime. Patient not taking: Reported on 10/28/2017 10/15/17 11/14/17  Kara Dies, NP  ondansetron (ZOFRAN ODT) 4 MG  disintegrating tablet Take 1 tablet (4 mg total) by mouth every 8 (eight) hours as needed for nausea or vomiting. 10/28/17   Deno Etienne, DO    Family History Family History  Problem Relation Age of Onset  . Osteoarthritis Mother   . COPD Mother   . Diabetes Mother   . Hypertension Mother   . Cancer - Lung Mother   . Cancer - Colon Mother   . Cancer Mother        breast   . Osteoarthritis Father   . Diabetes Brother   . Hypertension Brother   . Raynaud syndrome Daughter     Social History Social History   Tobacco Use  . Smoking status: Never Smoker  . Smokeless tobacco: Never Used  Substance Use Topics  . Alcohol use: No    Frequency: Never  . Drug use: No     Allergies    Patient has no known allergies.   Review of Systems Review of Systems  Constitutional: Negative for chills and fever.  HENT: Negative for congestion and rhinorrhea.   Eyes: Negative for redness and visual disturbance.  Respiratory: Negative for shortness of breath and wheezing.   Cardiovascular: Negative for chest pain and palpitations.  Gastrointestinal: Positive for abdominal pain. Negative for nausea and vomiting.  Genitourinary: Negative for dysuria and urgency.  Musculoskeletal: Positive for arthralgias and myalgias.  Skin: Negative for pallor and wound.  Neurological: Negative for dizziness and headaches.     Physical Exam Updated Vital Signs BP 122/62 (BP Location: Right Arm)   Pulse 79   Temp (!) 97.5 F (36.4 C) (Oral)   Resp 16   Ht 5\' 6"  (1.676 m)   Wt 131.5 kg (290 lb)   SpO2 97%   BMI 46.81 kg/m   Physical Exam  Constitutional: She is oriented to person, place, and time. She appears well-developed and well-nourished. No distress.  HENT:  Head: Normocephalic and atraumatic.  Eyes: Pupils are equal, round, and reactive to light. EOM are normal.  Neck: Normal range of motion. Neck supple.  Cardiovascular: Normal rate and regular rhythm. Exam reveals no gallop and no friction rub.  No murmur heard. Pulmonary/Chest: Effort normal. She has no wheezes. She has no rales.  Abdominal: Soft. She exhibits no distension. There is tenderness (mild) in the right upper quadrant. There is positive Murphy's sign.  Musculoskeletal: She exhibits no edema or tenderness.  Neurological: She is alert and oriented to person, place, and time.  Skin: Skin is warm and dry. She is not diaphoretic.  Psychiatric: She has a normal mood and affect. Her behavior is normal.  Nursing note and vitals reviewed.    ED Treatments / Results  Labs (all labs ordered are listed, but only abnormal results are displayed) Labs Reviewed  LIPASE, BLOOD - Abnormal; Notable for the following  components:      Result Value   Lipase 80 (*)    All other components within normal limits  COMPREHENSIVE METABOLIC PANEL - Abnormal; Notable for the following components:   Glucose, Bld 133 (*)    Creatinine, Ser 1.05 (*)    Calcium 10.5 (*)    AST 104 (*)    All other components within normal limits  CBC - Abnormal; Notable for the following components:   RBC 5.24 (*)    Hemoglobin 9.8 (*)    HCT 35.7 (*)    MCV 68.1 (*)    MCH 18.7 (*)    MCHC 27.5 (*)  RDW 18.4 (*)    All other components within normal limits  URINALYSIS, ROUTINE W REFLEX MICROSCOPIC  I-STAT BETA HCG BLOOD, ED ([facility], [facility], [facility] ONLY)    EKG None  Radiology US Abdomen Limited Ruq  Result Date: 10/28/2017 CLINICAL DATA:  Initial evaluation for acute right upper quadrant abdominal pain. EXAM: ULTRASOUND ABDOMEN LIMITED RIGHT UPPER QUADRANT COMPARISON:  None. FINDINGS: Gallbladder: Shadowing echogenic stones present within the gallbladder lumen, largest of which measures 6 mm. No gallbladder wall thickening or free pericholecystic fluid. Positive sonographic Percell Miller sign was elicited on exam. Common bile duct: Diameter: 3.4 mm Liver: No focal lesion identified. Increased echogenicity. Portal vein is patent on color Doppler imaging with normal direction of blood flow towards the liver. IMPRESSION: 1. Cholelithiasis with positive sonographic Murphy sign, with no other sonographic features for acute cholecystitis. 2. No biliary dilatation. 3. Increased hepatic echogenicity, suggesting steatosis. Electronically Signed   By: Jeannine Boga M.D.   On: 10/28/2017 06:42    Procedures Procedures (including critical care time)  Medications Ordered in ED Medications  ondansetron (ZOFRAN-ODT) disintegrating tablet 4 mg (has no administration in time range)     Initial Impression / Assessment and Plan / ED Course  I have reviewed the triage vital signs and the nursing notes.  Pertinent labs & imaging results that were  available during my care of the patient were reviewed by me and considered in my medical decision making (see chart for details).     52 yo F with a chief complaint of right upper quadrant abdominal pain.  This had resolved by my exam, she is minimally if at all tender to the right upper quadrant.  She does have a positive Murphy sign.  Her lipase is mildly elevated at 80.  There is no epigastric tenderness.  RUQ [place] with cholelithiasis without cholecystitis.  Pain continues to be resolved, some nausea.  Discussed possibility of surgical eval with + murphys on [place] and nausea, patient electing for outpatient eval.  Zofran for home.   7:13 AM:  I have discussed the diagnosis/risks/treatment options with the patient and believe the pt to be eligible for discharge home to follow-up with PCP, Gen surgery. We also discussed returning to the ED immediately if new or worsening sx occur. We discussed the sx which are most concerning (e.g., sudden worsening pain, fever, inability to tolerate by mouth) that necessitate immediate return. Medications administered to the patient during their visit and any new prescriptions provided to the patient are listed below.  Medications given during this visit Medications  ondansetron (ZOFRAN-ODT) disintegrating tablet 4 mg (has no administration in time range)      The patient appears reasonably screen and/or stabilized for discharge and I doubt any other medical condition or other [facility] requiring further screening, evaluation, or treatment in the ED at this time prior to discharge.    Final Clinical Impressions(s) / ED Diagnoses   Final diagnoses:  Abdominal pain, RUQ  Calculus of gallbladder without cholecystitis without obstruction    ED Discharge Orders        Ordered    ondansetron (ZOFRAN ODT) 4 MG disintegrating tablet  Every 8 hours PRN     10/28/17 [place], [place], DO 10/28/17 [phone]

## 2017-10-28 NOTE — ED Triage Notes (Signed)
Pt reports RUQ pain with nausea and diarrhea that began earlier tonight, states she thinks it is her gallbladder.

## 2017-10-28 NOTE — Discharge Instructions (Signed)
Return for any worsening symptoms.  See the general surgeon in the office for possible elective removal of your gallbladder.  Give them a call on Monday to schedule an appointment.  Try to avoid fatty foods until you see them in the office.

## 2017-10-28 NOTE — ED Notes (Signed)
Pt unable to void at this time. 

## 2017-11-09 ENCOUNTER — Other Ambulatory Visit: Payer: Self-pay | Admitting: Nurse Practitioner

## 2017-12-25 MED FILL — VALSARTAN 160 MG TABS: 160 | 90 days supply | Qty: 90 | Fill #0

## 2017-12-25 MED FILL — AMLODIPINE BESYLATE 5 MG TA: 5 | 90 days supply | Qty: 180 | Fill #0

## 2018-01-04 ENCOUNTER — Ambulatory Visit (INDEPENDENT_AMBULATORY_CARE_PROVIDER_SITE_OTHER): Payer: Self-pay | Admitting: Nurse Practitioner

## 2018-01-04 VITALS — BP 130/85 | HR 90 | Temp 98.6°F | Resp 20 | Wt 291.2 lb

## 2018-01-04 DIAGNOSIS — M1990 Unspecified osteoarthritis, unspecified site: Secondary | ICD-10-CM

## 2018-01-04 DIAGNOSIS — M79641 Pain in right hand: Secondary | ICD-10-CM

## 2018-01-04 DIAGNOSIS — R0981 Nasal congestion: Secondary | ICD-10-CM

## 2018-01-04 MED ORDER — PREDNISONE 10 MG (21) PO TBPK: ORAL_TABLET | ORAL | 0 refills | Status: AC

## 2018-01-04 MED FILL — VYVANSE 70 MG CAPSULE: 70 | 30 days supply | Qty: 30 | Fill #0

## 2018-01-04 NOTE — Progress Notes (Signed)
Subjective:    Patient ID: Debra Mathews, female    DOB: 1966/02/21, 52 y.o.   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  States that she took a CPR renewal course, and had to do compressions as part of the course requirements.  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  She has a history of inflammatory arthritis, and uses Voltaren patches.  Patient denies fever, chills, injury, weakness, numbness, or tingling.  Hand Pain   The incident occurred 1 to 3 hours ago (history of inflammatory arthritis). The incident occurred at work. The injury mechanism was repetitive motion (patient was taking CPR class today and start after performing compressions). The pain is present in the right hand. The quality of the pain is described as aching. Radiates to: right wrist. The pain has been constant since the incident. Pertinent negatives include no muscle weakness, numbness or tingling. The symptoms are aggravated by palpation. She has tried nothing for the symptoms.  URI   This is a new problem. The current episode started in the past 7 days. The problem has been unchanged. There has been no fever. Associated symptoms include rhinorrhea and sinus pain. Pertinent negatives include no ear pain, headaches, sneezing or sore throat. She has tried antihistamine for the symptoms. The treatment provided mild relief.     Review of Systems  Constitutional: Negative.   HENT: Positive for rhinorrhea and sinus pain. Negative for ear pain, sneezing and sore throat.   Eyes: Negative.   Respiratory: Negative.   Cardiovascular: Negative.   Musculoskeletal: Positive for arthralgias.       Right hand pain and swelling  Skin: Negative.   Neurological: Negative for tingling, numbness and headaches.       Objective:   Physical Exam  Constitutional: She is oriented to person, place, and time. She  appears well-developed and well-nourished. No distress.  HENT:  Head: Normocephalic and atraumatic.  Right Ear: External ear normal.  Left Ear: External ear normal.  + nasal congestion, turbinates inflammed and swollen  Eyes: Pupils are equal, round, and reactive to light. EOM are normal.  Neck: Normal range of motion. Neck supple. No tracheal deviation present. No thyromegaly present.  Cardiovascular: Normal rate, regular rhythm and normal heart sounds.  Pulmonary/Chest: Breath sounds normal. She is in respiratory distress.  Abdominal: Soft. Bowel sounds are normal.  Musculoskeletal: Normal range of motion. She exhibits edema (right hand) and tenderness (tenderness to right wrist). She exhibits no deformity.  Neurological: She is alert and oriented to person, place, and time.  Skin: Skin is warm and dry.  Psychiatric: She has a normal mood and affect. Her behavior is normal.      Assessment & Plan:  Inflammatory Arthritis of Right Hand and Right Hand Pain, Nasal Congestion  Exam findings, diagnosis etiology and medication use and indications reviewed with patient. Follow- Up and discharge instructions provided. No emergent/urgent issues found on exam.  Patient education was provided.  Patient verbalized understanding of information provided and agrees with plan of care (POC), all questions answered. The patient is advised to call or return to clinic if he does not see an improvement in symptoms, or to seek the care of the closest emergency department if condition worsens with the above plan.   1. Inflammatory arthritis  - predniSONE (STERAPRED UNI-PAK 21 TAB) 10 MG (21) TBPK tablet; 50mg   x 2 days, 40mg  x 2 days, 30mg  x 2 days, 20mg  x 2 days, 10mg  x 2 days  Dispense: 30 tablet; Refill: 0  2. Right hand pain  - predniSONE (STERAPRED UNI-PAK 21 TAB) 10 MG (21) TBPK tablet; 50mg  x 2 days, 40mg  x 2 days, 30mg  x 2 days, 20mg  x 2 days, 10mg  x 2 days  Dispense: 30 tablet; Refill: 0  3. Nasal  Congestion -Patient will take Singulair previously prescribed.

## 2018-01-04 NOTE — Patient Instructions (Signed)
Arthritis Arthritis is a term that is commonly used to refer to joint pain or joint disease. There are more than 100 types of arthritis. What are the causes? The most common cause of this condition is wear and tear of a joint. Other causes include:  Gout.  Inflammation of a joint.  An infection of a joint.  Sprains and other injuries near the joint.  A drug reaction or allergic reaction.  In some cases, the cause may not be known. What are the signs or symptoms? The main symptom of this condition is pain in the joint with movement. Other symptoms include:  Redness, swelling, or stiffness at a joint.  Warmth coming from the joint.  Fever.  Overall feeling of illness.  How is this diagnosed? This condition may be diagnosed with a physical exam and tests, including:  Blood tests.  Urine tests.  Imaging tests, such as MRI, X-rays, or a CT scan.  Sometimes, fluid is removed from a joint for testing. How is this treated? Treatment for this condition may involve:  Treatment of the cause, if it is known.  Rest.  Raising (elevating) the joint.  Applying cold or hot packs to the joint.  Medicines to improve symptoms and reduce inflammation.  Injections of a steroid such as cortisone into the joint to help reduce pain and inflammation.  Depending on the cause of your arthritis, you may need to make lifestyle changes to reduce stress on your joint. These changes may include exercising more and losing weight. Follow these instructions at home: Medicines  Take over-the-counter and prescription medicines only as told by your health care provider.  Do not take aspirin to relieve pain if gout is suspected. Activity  Rest your joint if told by your health care provider. Rest is important when your disease is active and your joint feels painful, swollen, or stiff.  Avoid activities that make the pain worse. It is important to balance activity with rest.  Exercise your  joint regularly with range-of-motion exercises as told by your health care provider. Try doing low-impact exercise, such as: ? Swimming. ? Water aerobics. ? Biking. ? Walking. Joint Care   If your joint is swollen, keep it elevated if told by your health care provider.  If your joint feels stiff in the morning, try taking a warm shower.  If directed, apply heat to the joint. If you have diabetes, do not apply heat without permission from your health care provider. ? Put a towel between the joint and the hot pack or heating pad. ? Leave the heat on the area for 20-30 minutes.  If directed, apply ice to the joint: ? Put ice in a plastic bag. ? Place a towel between your skin and the bag. ? Leave the ice on for 20 minutes, 2-3 times per day.  Keep all follow-up visits as told by your health care provider. This is important. Contact a health care provider if:  The pain gets worse.  You have a fever. Get help right away if:  You develop severe joint pain, swelling, or redness.  Many joints become painful and swollen.  You develop severe back pain.  You develop severe weakness in your leg.  You cannot control your bladder or bowels. This information is not intended to replace advice given to you by your health care provider. Make sure you discuss any questions you have with your health care provider. Document Released: 06/02/2004 Document Revised: 10/01/2015 Document Reviewed: 07/21/2014 Elsevier Interactive Patient   Education  2018 [place] Pain Many things can cause hand pain. Some common causes are:  An injury.  Repeating the same movement with your hand over and over (overuse).  Osteoporosis.  Arthritis.  Lumps in the tendons or joints of the hand and wrist (ganglion cysts).  Infection.  Follow these instructions at home: Pay attention to any changes in your symptoms. Take these actions to help with your discomfort:  If directed, put ice on the  affected area: ? Put ice in a plastic bag. ? Place a towel between your skin and the bag. ? Leave the ice on for 15-20 minutes, 3?4 times a day for 2 days.  Take over-the-counter and prescription medicines only as told by your health care provider.  Minimize stress on your hands and wrists as much as possible.  Take breaks from repetitive activity often.  Do stretches as told by your health care provider.  Do not do activities that make your pain worse.  Contact a health care provider if:  Your pain does not get better after a few days of self-care.  Your pain gets worse.  Your pain affects your ability to do your daily activities. Get help right away if:  Your hand becomes warm, red, or swollen.  Your hand is numb or tingling.  Your hand is extremely swollen or deformed.  Your hand or fingers turn white or blue.  You cannot move your hand, wrist, or fingers. This information is not intended to replace advice given to you by your health care provider. Make sure you discuss any questions you have with your health care provider. Document Released: 05/22/2015 Document Revised: 10/01/2015 Document Reviewed: 05/21/2014 Elsevier Interactive Patient Education  [employer].

## 2018-01-05 MED FILL — predniSONE 10 MG TABS: 10 | 10 days supply | Qty: 30 | Fill #0

## 2018-02-07 MED FILL — VYVANSE 70 MG CAPSULE: 70 | 30 days supply | Qty: 30 | Fill #0

## 2018-02-08 MED FILL — LEVOCETIRIZINE 5 MG TABLET: 5 | 90 days supply | Qty: 90 | Fill #0

## 2018-02-08 MED FILL — DICLOFENAC EPOLAMINE 1.3 %: 1.3 | 30 days supply | Qty: 60 | Fill #0

## 2018-02-28 MED FILL — ALPRAZolam 0.5 MG TABS: 0.5 | 30 days supply | Qty: 60 | Fill #0

## 2018-03-07 ENCOUNTER — Ambulatory Visit (INDEPENDENT_AMBULATORY_CARE_PROVIDER_SITE_OTHER): Payer: Self-pay | Admitting: Family Medicine

## 2018-03-07 VITALS — BP 130/72 | Temp 98.8°F | Wt 287.6 lb

## 2018-03-07 DIAGNOSIS — K0889 Other specified disorders of teeth and supporting structures: Secondary | ICD-10-CM

## 2018-03-07 MED ORDER — AMOXICILLIN 875 MG PO TABS: 875.0000 mg | ORAL_TABLET | Freq: Two times a day (BID) | ORAL | 0 refills | Status: DC

## 2018-03-07 MED ORDER — IBUPROFEN 600 MG PO TABS: 600.0000 mg | ORAL_TABLET | Freq: Three times a day (TID) | ORAL | 0 refills | Status: DC | PRN

## 2018-03-07 MED FILL — AMOXICILLIN 875 MG TABLET: 875 | 10 days supply | Qty: 20 | Fill #0

## 2018-03-07 MED FILL — IBUPROFEN 600 MG TABLET: 600 | 10 days supply | Qty: 30 | Fill #0

## 2018-03-07 NOTE — Patient Instructions (Signed)
Dental Pain Dental pain may be caused by many things, including:  Tooth decay (cavities or caries). Cavities expose the nerve of your tooth to air and hot or cold temperatures. This can cause pain or discomfort.  Abscess or infection. A dental abscess is a collection of infected pus from a bacterial infection in the inner part of the tooth (pulp). It usually occurs at the end of the tooth's root.  Injury.  An unknown reason (idiopathic).  Your pain may be mild or severe. It may only occur when:  You are chewing.  You are exposed to hot or cold temperature.  You are eating or drinking sugary foods or beverages, such as soda or candy.  Your pain may also be constant. Follow these instructions at home: Watch your dental pain for any changes. The following actions may help to lessen any discomfort that you are feeling:  Take medicines only as directed by your dentist.  If you were prescribed an antibiotic medicine, finish all of it even if you start to feel better.  Keep all follow-up visits as directed by your dentist. This is important.  Do not apply heat to the outside of your face.  Rinse your mouth or gargle with salt water if directed by your dentist. This helps with pain and swelling. ? You can make salt water by adding  tsp of salt to 1 cup of warm water.  Apply ice to the painful area of your face: ? Put ice in a plastic bag. ? Place a towel between your skin and the bag. ? Leave the ice on for 20 minutes, 2-3 times per day.  Avoid foods or drinks that cause you pain, such as: ? Very hot or very cold foods or drinks. ? Sweet or sugary foods or drinks.  Contact a health care provider if:  Your pain is not controlled with medicines.  Your symptoms are worse.  You have new symptoms. Get help right away if:  You are unable to open your mouth.  You are having trouble breathing or swallowing.  You have a fever.  Your face, neck, or jaw is swollen. This  information is not intended to replace advice given to you by your health care provider. Make sure you discuss any questions you have with your health care provider. Document Released: 04/25/2005 Document Revised: 09/03/2015 Document Reviewed: 04/21/2014 Elsevier Interactive Patient Education  2018 [employer]  

## 2018-03-07 NOTE — Progress Notes (Signed)
Debra Mathews is a 52 y.o. female who presents today with concerns of tooth pain to the right upper tooth. Patient is under the care of a dentist at this time who is performing root canal and multiple fillings and crowns. She has not contacted the about this issue up to this point with symptoms beginning the last week and the filling falling out around 2 weeks ago.  Review of Systems  Constitutional: Negative for chills, fever and malaise/fatigue.  HENT: Positive for sore throat. Negative for congestion, ear discharge, ear pain and sinus pain.        Tooth pain  Eyes: Negative.   Respiratory: Negative for cough, sputum production and shortness of breath.   Cardiovascular: Negative.  Negative for chest pain.  Gastrointestinal: Negative for abdominal pain, diarrhea, nausea and vomiting.  Genitourinary: Negative for dysuria, frequency, hematuria and urgency.  Musculoskeletal: Negative for myalgias.  Skin: Negative.   Neurological: Negative for headaches.  Endo/Heme/Allergies: Negative.   Psychiatric/Behavioral: Negative.     O: Vitals:   03/07/18 1706  BP: 130/72  Temp: 98.8 F (37.1 C)     Physical Exam  Constitutional: She is oriented to person, place, and time. Vital signs are normal. She appears well-developed and well-nourished. She is active.  Non-toxic appearance. She does not have a sickly appearance.  HENT:  Head: Normocephalic.  Right Ear: Hearing, tympanic membrane, external ear and ear canal normal.  Left Ear: Hearing, tympanic membrane, external ear and ear canal normal.  Nose: Nose normal.  Mouth/Throat: Uvula is midline and oropharynx is clear and moist. Normal dentition.  Right upper back molars some evidence of broken teeth and silver repair- no overwhelming evidence of oral erythema or discharge. Patient does report pain on palpation.  Neck: Normal range of motion. Neck supple.  Cardiovascular: Normal rate, regular rhythm, normal heart sounds and normal pulses.   Pulmonary/Chest: Effort normal and breath sounds normal.  Abdominal: Soft. Bowel sounds are normal.  Musculoskeletal: Normal range of motion.  Lymphadenopathy:       Head (right side): No submental and no submandibular adenopathy present.       Head (left side): No submental and no submandibular adenopathy present.    She has no cervical adenopathy.  Neurological: She is alert and oriented to person, place, and time.  Psychiatric: She has a normal mood and affect.  Vitals reviewed.  A: 1. Tooth pain    P: Discussed exam findings, diagnosis etiology and medication use and indications reviewed with patient. Follow- Up and discharge instructions provided. No emergent/urgent issues found on exam.  Patient verbalized understanding of information provided and agrees with plan of care (POC), all questions answered.  Advised that future concerns related to this issue should be directed to dentist who is executing treatment plan for this condition.  1. Tooth pain - ibuprofen (ADVIL,MOTRIN) 600 MG tablet; Take 1 tablet (600 mg total) by mouth every 8 (eight) hours as needed. - amoxicillin (AMOXIL) 875 MG tablet; Take 1 tablet (875 mg total) by mouth 2 (two) times daily.

## 2018-03-09 ENCOUNTER — Telehealth: Payer: Self-pay

## 2018-03-09 NOTE — Telephone Encounter (Signed)
Patient states she is doing well.

## 2018-03-14 MED FILL — VYVANSE 70 MG CAPSULE: 70 | 30 days supply | Qty: 30 | Fill #0

## 2018-04-11 MED FILL — VYVANSE 70 MG CAPSULE: 70 | 30 days supply | Qty: 30 | Fill #0

## 2018-04-20 MED FILL — AMLODIPINE BESYLATE 5 MG TA: 5 | 90 days supply | Qty: 180 | Fill #1

## 2018-05-10 MED FILL — VYVANSE 70 MG CAPSULE: 70 | 30 days supply | Qty: 30 | Fill #0

## 2018-05-22 ENCOUNTER — Ambulatory Visit (INDEPENDENT_AMBULATORY_CARE_PROVIDER_SITE_OTHER): Payer: Self-pay | Admitting: Nurse Practitioner

## 2018-05-22 VITALS — BP 140/70 | HR 104 | Temp 100.2°F | Wt 288.8 lb

## 2018-05-22 DIAGNOSIS — R6889 Other general symptoms and signs: Secondary | ICD-10-CM

## 2018-05-22 LAB — POCT INFLUENZA A/B
Influenza A, POC: NEGATIVE
Influenza B, POC: NEGATIVE

## 2018-05-22 NOTE — Patient Instructions (Signed)
Influenza, Adult -Ibuprofen or Tylenol for pain, fever, or general discomfort. -Increase fluids. -Continue Xyzal for allergy symptoms.  This may also help with the right eustachian tube dysfunction. -Continue warm compresses to the right neck to help with neck pain or discomfort.   -Sleep elevated on at least 2 pillows at bedtime to help with cough. -Use a humidifier or vaporizer when at home and during sleep to help with cough. -May use a teaspoon of honey or over-the-counter cough drops to help with cough. -Patient will continue symptomatic treatment as influenza test was negative.   -Follow-up if symptoms worsen or do not improve, follow up in our office.   Influenza, more commonly known as "the flu," is a viral infection that mainly affects the respiratory tract. The respiratory tract includes organs that help you breathe, such as the lungs, nose, and throat. The flu causes many symptoms similar to the common cold along with high fever and body aches. The flu spreads easily from person to person (is contagious). Getting a flu shot (influenza vaccination) every year is the best way to prevent the flu. What are the causes? This condition is caused by the influenza virus. You can get the virus by:  Breathing in droplets that are in the air from an infected person's cough or sneeze.  Touching something that has been exposed to the virus (has been contaminated) and then touching your mouth, nose, or eyes. What increases the risk? The following factors may make you more likely to get the flu:  Not washing or sanitizing your hands often.  Having close contact with many people during cold and flu season.  Touching your mouth, eyes, or nose without first washing or sanitizing your hands.  Not getting a yearly (annual) flu shot. You may have a higher risk for the flu, including serious problems such as a lung infection (pneumonia), if you:  Are older than 65.  Are pregnant.  Have a  weakened disease-fighting system (immune system). You may have a weakened immune system if you: ? Have HIV or AIDS. ? Are undergoing chemotherapy. ? Are taking medicines that reduce (suppress) the activity of your immune system.  Have a long-term (chronic) illness, such as heart disease, kidney disease, diabetes, or lung disease.  Have a liver disorder.  Are severely overweight (morbidly obese).  Have anemia. This is a condition that affects your red blood cells.  Have asthma. What are the signs or symptoms? Symptoms of this condition usually begin suddenly and last 4-14 days. They may include:  Fever and chills.  Headaches, body aches, or muscle aches.  Sore throat.  Cough.  Runny or stuffy (congested) nose.  Chest discomfort.  Poor appetite.  Weakness or fatigue.  Dizziness.  Nausea or vomiting. How is this diagnosed? This condition may be diagnosed based on:  Your symptoms and medical history.  A physical exam.  Swabbing your nose or throat and testing the fluid for the influenza virus. How is this treated? If the flu is diagnosed early, you can be treated with medicine that can help reduce how severe the illness is and how long it lasts (antiviral medicine). This may be given by mouth (orally) or through an IV. Taking care of yourself at home can help relieve symptoms. Your health care provider may recommend:  Taking over-the-counter medicines.  Drinking plenty of fluids. In many cases, the flu goes away on its own. If you have severe symptoms or complications, you may be treated in a hospital. Follow  these instructions at home: Activity  Rest as needed and get plenty of sleep.  Stay home from work or school as told by your health care provider. Unless you are visiting your health care provider, avoid leaving home until your fever has been gone for 24 hours without taking medicine. Eating and drinking  Take an oral rehydration solution (ORS). This is a  drink that is sold at pharmacies and retail stores.  Drink enough fluid to keep your urine pale yellow.  Drink clear fluids in small amounts as you are able. Clear fluids include water, ice chips, diluted fruit juice, and low-calorie sports drinks.  Eat bland, easy-to-digest foods in small amounts as you are able. These foods include bananas, applesauce, rice, lean meats, toast, and crackers.  Avoid drinking fluids that contain a lot of sugar or caffeine, such as energy drinks, regular sports drinks, and soda.  Avoid alcohol.  Avoid spicy or fatty foods. General instructions      Take over-the-counter and prescription medicines only as told by your health care provider.  Use a cool mist humidifier to add humidity to the air in your home. This can make it easier to breathe.  Cover your mouth and nose when you cough or sneeze.  Wash your hands with soap and water often, especially after you cough or sneeze. If soap and water are not available, use alcohol-based hand sanitizer.  Keep all follow-up visits as told by your health care provider. This is important. How is this prevented?   Get an annual flu shot. You may get the flu shot in late summer, fall, or winter. Ask your health care provider when you should get your flu shot.  Avoid contact with people who are sick during cold and flu season. This is generally fall and winter. Contact a health care provider if:  You develop new symptoms.  You have: ? Chest pain. ? Diarrhea. ? A fever.  Your cough gets worse.  You produce more mucus.  You feel nauseous or you vomit. Get help right away if:  You develop shortness of breath or difficulty breathing.  Your skin or nails turn a bluish color.  You have severe pain or stiffness in your neck.  You develop a sudden headache or sudden pain in your face or ear.  You cannot eat or drink without vomiting. Summary  Influenza, more commonly known as "the flu," is a viral  infection that primarily affects your respiratory tract.  Symptoms of the flu usually begin suddenly and last 4-14 days.  Getting an annual flu shot is the best way to prevent getting the flu.  Stay home from work or school as told by your health care provider. Unless you are visiting your health care provider, avoid leaving home until your fever has been gone for 24 hours without taking medicine.  Keep all follow-up visits as told by your health care provider. This is important. This information is not intended to replace advice given to you by your health care provider. Make sure you discuss any questions you have with your health care provider. Document Released: 04/22/2000 Document Revised: 10/11/2017 Document Reviewed: 10/11/2017 Elsevier Interactive Patient Education  2019 [employer].

## 2018-05-22 NOTE — Progress Notes (Signed)
Subjective:     Debra Mathews is a 53 y.o. female who presents for evaluation of influenza like symptoms. Symptoms include suspected fevers but not measured at home, chills, dry cough, headache, myalgias, sinus and nasal congestion and sore throat and have been present for 2 days. She has tried to alleviate the symptoms with Mucinex, Advil with minimal relief. High risk factors for influenza complications: none.  Patient informs that she saw patient last week with the same or similar symptoms, and then developed her symptoms 1 to 2 days after that.  The following portions of the patient's history were reviewed and updated as appropriate: allergies, current medications and past medical history.  Review of Systems Constitutional: positive for anorexia, chills, fatigue and fevers, negative for night sweats, sweats and weight loss Eyes: negative Ears, nose, mouth, throat, and face: positive for nasal congestion, sore throat and right ear fullness/pressure, negative for ear drainage, earaches and hoarseness Respiratory: positive for cough, negative for asthma, chronic bronchitis, dyspnea on exertion, stridor and wheezing Cardiovascular: negative Gastrointestinal: positive for decreased appetite, negative for abdominal pain, nausea and vomiting Musculoskeletal:positive for neck pain, negative for back pain and muscle weakness Neurological: positive for dizziness and headaches, negative for coordination problems, gait problems, paresthesia and weakness     Objective:    BP 140/70   Pulse (!) 104   Temp 100.2 F (37.9 C)   Wt 288 lb 12.8 oz (131 kg)   SpO2 97%   BMI 46.61 kg/m  General appearance: alert, cooperative, fatigued and no distress Head: Normocephalic, without obvious abnormality, atraumatic Eyes: conjunctivae/corneas clear. PERRL, EOM's intact. Fundi benign. Ears: normal TM and external ear canal left ear and abnormal TM right ear - mucoid middle ear fluid Nose: no discharge, mild  congestion, turbinates swollen, inflamed, mild maxillary sinus tenderness right, mild frontal sinus tenderness right Throat: lips, mucosa, and tongue normal; teeth and gums normal Lungs: clear to auscultation bilaterally Heart: S1, S2 normal and mild tachycardia Abdomen: soft, non-tender; bowel sounds normal; no masses,  no organomegaly Pulses: 2+ and symmetric Skin: Skin color, texture, turgor normal. No rashes or lesions Lymph nodes: + posterior cervical node, right Neurologic: Grossly normal    Assessment:    Influenza like syndrome    Plan:   Exam findings, diagnosis etiology and medication use and indications reviewed with patient. Follow- Up and discharge instructions provided. No emergent/urgent issues found on exam.  Based on the patient's clinical presentation, symptoms, duration of symptoms, and physical assessment, these findings are congruent with that of viral etiology.  Patient's influenza test was negative.  Patient does display influenza-like symptoms at this time.  Patient did not want Tamiflu at this time, but we will continue symptomatic treatment for her symptoms to include fluids, rest, and to remain home until fever free for 24 hours.  Patient education was provided. Patient verbalized understanding of information provided and agrees with plan of care (POC), all questions answered. The patient is advised to call or return to clinic if condition does not see an improvement in symptoms, or to seek the care of the closest emergency department if condition worsens with the above plan.   1. Flu-like symptoms  - POCT Influenza A/B  2. Influenza-like symptoms  -Ibuprofen or Tylenol for pain, fever, or general discomfort. -Increase fluids. -Continue Xyzal for allergy symptoms.  This may also help with the right eustachian tube dysfunction. -Continue warm compresses to the right neck to help with neck pain or discomfort.   -Sleep  elevated on at least 2 pillows at bedtime to  help with cough. -Use a humidifier or vaporizer when at home and during sleep to help with cough. -May use a teaspoon of honey or over-the-counter cough drops to help with cough. -Patient will continue symptomatic treatment as influenza test was negative.   -Follow-up if symptoms worsen or do not improve, follow up in our office.

## 2018-05-24 ENCOUNTER — Ambulatory Visit (INDEPENDENT_AMBULATORY_CARE_PROVIDER_SITE_OTHER): Payer: Self-pay | Admitting: Nurse Practitioner

## 2018-05-24 VITALS — BP 140/80 | HR 95 | Temp 98.6°F | Resp 18 | Wt 290.0 lb

## 2018-05-24 DIAGNOSIS — B9689 Other specified bacterial agents as the cause of diseases classified elsewhere: Secondary | ICD-10-CM

## 2018-05-24 DIAGNOSIS — J019 Acute sinusitis, unspecified: Secondary | ICD-10-CM

## 2018-05-24 DIAGNOSIS — H6991 Unspecified Eustachian tube disorder, right ear: Secondary | ICD-10-CM

## 2018-05-24 DIAGNOSIS — H6981 Other specified disorders of Eustachian tube, right ear: Secondary | ICD-10-CM

## 2018-05-24 MED ORDER — FLUCONAZOLE 150 MG PO TABS: 150.0000 mg | ORAL_TABLET | Freq: Once | ORAL | 0 refills | Status: AC

## 2018-05-24 MED ORDER — AMOXICILLIN-POT CLAVULANATE 875-125 MG PO TABS: 1.0000 | ORAL_TABLET | Freq: Two times a day (BID) | ORAL | 0 refills | Status: AC

## 2018-05-24 MED ORDER — PREDNISONE 50 MG PO TABS: ORAL_TABLET | ORAL | 0 refills | Status: AC

## 2018-05-24 NOTE — Patient Instructions (Addendum)
Sinusitis, Adult  -Take medication as prescribed. -Ibuprofen or Tylenol for pain, fever, or general discomfort. -Increase fluids. -Sleep elevated on at least 2 pillows at bedtime to help with cough. -Use a humidifier or vaporizer when at home and during sleep. -May use a teaspoon of honey or over-the-counter cough drops to help with cough. -May use normal saline nasal spray to help with nasal congestion throughout the day. -Follow-up if symptoms do not improve. -RTW note to return on May 25, 2018.   Sinusitis is inflammation of your sinuses. Sinuses are hollow spaces in the bones around your face. Your sinuses are located:  Around your eyes.  In the middle of your forehead.  Behind your nose.  In your cheekbones. Mucus normally drains out of your sinuses. When your nasal tissues become inflamed or swollen, mucus can become trapped or blocked. This allows bacteria, viruses, and fungi to grow, which leads to infection. Most infections of the sinuses are caused by a virus. Sinusitis can develop quickly. It can last for up to 4 weeks (acute) or for more than 12 weeks (chronic). Sinusitis often develops after a cold. What are the causes? This condition is caused by anything that creates swelling in the sinuses or stops mucus from draining. This includes:  Allergies.  Asthma.  Infection from bacteria or viruses.  Deformities or blockages in your nose or sinuses.  Abnormal growths in the nose (nasal polyps).  Pollutants, such as chemicals or irritants in the air.  Infection from fungi (rare). What increases the risk? You are more likely to develop this condition if you:  Have a weak body defense system (immune system).  Do a lot of swimming or diving.  Overuse nasal sprays.  Smoke. What are the signs or symptoms? The main symptoms of this condition are pain and a feeling of pressure around the affected sinuses. Other symptoms include:  Stuffy nose or  congestion.  Thick drainage from your nose.  Swelling and warmth over the affected sinuses.  Headache.  Upper toothache.  A cough that may get worse at night.  Extra mucus that collects in the throat or the back of the nose (postnasal drip).  Decreased sense of smell and taste.  Fatigue.  A fever.  Sore throat.  Bad breath. How is this diagnosed? This condition is diagnosed based on:  Your symptoms.  Your medical history.  A physical exam.  Tests to find out if your condition is acute or chronic. This may include: ? Checking your nose for nasal polyps. ? Viewing your sinuses using a device that has a light (endoscope). ? Testing for allergies or bacteria. ? Imaging tests, such as an MRI or CT scan. In rare cases, a bone biopsy may be done to rule out more serious types of fungal sinus disease. How is this treated? Treatment for sinusitis depends on the cause and whether your condition is chronic or acute.  If caused by a virus, your symptoms should go away on their own within 10 days. You may be given medicines to relieve symptoms. They include: ? Medicines that shrink swollen nasal passages (topical intranasal decongestants). ? Medicines that treat allergies (antihistamines). ? A spray that eases inflammation of the nostrils (topical intranasal corticosteroids). ? Rinses that help get rid of thick mucus in your nose (nasal saline washes).  If caused by bacteria, your health care provider may recommend waiting to see if your symptoms improve. Most bacterial infections will get better without antibiotic medicine. You may be given  antibiotics if you have: ? A severe infection. ? A weak immune system.  If caused by narrow nasal passages or nasal polyps, you may need to have surgery. Follow these instructions at home: Medicines  Take, use, or apply over-the-counter and prescription medicines only as told by your health care provider. These may include nasal  sprays.  If you were prescribed an antibiotic medicine, take it as told by your health care provider. Do not stop taking the antibiotic even if you start to feel better. Hydrate and humidify   Drink enough fluid to keep your urine pale yellow. Staying hydrated will help to thin your mucus.  Use a cool mist humidifier to keep the humidity level in your home above 50%.  Inhale steam for 10-15 minutes, 3-4 times a day, or as told by your health care provider. You can do this in the bathroom while a hot shower is running.  Limit your exposure to cool or dry air. Rest  Rest as much as possible.  Sleep with your head raised (elevated).  Make sure you get enough sleep each night. General instructions   Apply a warm, moist washcloth to your face 3-4 times a day or as told by your health care provider. This will help with discomfort.  Wash your hands often with soap and water to reduce your exposure to germs. If soap and water are not available, use hand sanitizer.  Do not smoke. Avoid being around people who are smoking (secondhand smoke).  Keep all follow-up visits as told by your health care provider. This is important. Contact a health care provider if:  You have a fever.  Your symptoms get worse.  Your symptoms do not improve within 10 days. Get help right away if:  You have a severe headache.  You have persistent vomiting.  You have severe pain or swelling around your face or eyes.  You have vision problems.  You develop confusion.  Your neck is stiff.  You have trouble breathing. Summary  Sinusitis is soreness and inflammation of your sinuses. Sinuses are hollow spaces in the bones around your face.  This condition is caused by nasal tissues that become inflamed or swollen. The swelling traps or blocks the flow of mucus. This allows bacteria, viruses, and fungi to grow, which leads to infection.  If you were prescribed an antibiotic medicine, take it as told by  your health care provider. Do not stop taking the antibiotic even if you start to feel better.  Keep all follow-up visits as told by your health care provider. This is important. This information is not intended to replace advice given to you by your health care provider. Make sure you discuss any questions you have with your health care provider. Document Released: 04/25/2005 Document Revised: 09/25/2017 Document Reviewed: 09/25/2017 Elsevier Interactive Patient Education  2019 [employer].  Eustachian Tube Dysfunction  Eustachian tube dysfunction refers to a condition in which a blockage develops in the narrow passage that connects the middle ear to the back of the nose (eustachian tube). The eustachian tube regulates air pressure in the middle ear by letting air move between the ear and nose. It also helps to drain fluid from the middle ear space. Eustachian tube dysfunction can affect one or both ears. When the eustachian tube does not function properly, air pressure, fluid, or both can build up in the middle ear. What are the causes? This condition occurs when the eustachian tube becomes blocked or cannot open normally. Common  causes of this condition include:  Ear infections.  Colds and other infections that affect the nose, mouth, and throat (upper respiratory tract).  Allergies.  Irritation from cigarette smoke.  Irritation from stomach acid coming up into the esophagus (gastroesophageal reflux). The esophagus is the tube that carries food from the mouth to the stomach.  Sudden changes in air pressure, such as from descending in an airplane or scuba diving.  Abnormal growths in the nose or throat, such as: ? Growths that line the nose (nasal polyps). ? Abnormal growth of cells (tumors). ? Enlarged tissue at the back of the throat (adenoids). What increases the risk? You are more likely to develop this condition if:  You smoke.  You are overweight.  You are a child who  has: ? Certain birth defects of the mouth, such as cleft palate. ? Large tonsils or adenoids. What are the signs or symptoms? Common symptoms of this condition include:  A feeling of fullness in the ear.  Ear pain.  Clicking or popping noises in the ear.  Ringing in the ear.  Hearing loss.  Loss of balance.  Dizziness. Symptoms may get worse when the air pressure around you changes, such as when you travel to an area of high elevation, fly on an airplane, or go scuba diving. How is this diagnosed? This condition may be diagnosed based on:  Your symptoms.  A physical exam of your ears, nose, and throat.  Tests, such as those that measure: ? The movement of your eardrum (tympanogram). ? Your hearing (audiometry). How is this treated? Treatment depends on the cause and severity of your condition.  In mild cases, you may relieve your symptoms by moving air into your ears. This is called "popping the ears."  In more severe cases, or if you have symptoms of fluid in your ears, treatment may include: ? Medicines to relieve congestion (decongestants). ? Medicines that treat allergies (antihistamines). ? Nasal sprays or ear drops that contain medicines that reduce swelling (steroids). ? A procedure to drain the fluid in your eardrum (myringotomy). In this procedure, a small tube is placed in the eardrum to:  Drain the fluid.  Restore the air in the middle ear space. ? A procedure to insert a balloon device through the nose to inflate the opening of the eustachian tube (balloon dilation). Follow these instructions at home: Lifestyle  Do not do any of the following until your health care provider approves: ? Travel to high altitudes. ? Fly in airplanes. ? Work in a Pension scheme manager or room. ? Scuba dive.  Do not use any products that contain nicotine or tobacco, such as cigarettes and e-cigarettes. If you need help quitting, ask your health care provider.  Keep your ears  dry. Wear fitted earplugs during showering and bathing. Dry your ears completely after. General instructions  Take over-the-counter and prescription medicines only as told by your health care provider.  Use techniques to help pop your ears as recommended by your health care provider. These may include: ? Chewing gum. ? Yawning. ? Frequent, forceful swallowing. ? Closing your mouth, holding your nose closed, and gently blowing as if you are trying to blow air out of your nose.  Keep all follow-up visits as told by your health care provider. This is important. Contact a health care provider if:  Your symptoms do not go away after treatment.  Your symptoms come back after treatment.  You are unable to pop your ears.  You have: ?  A fever. ? Pain in your ear. ? Pain in your head or neck. ? Fluid draining from your ear.  Your hearing suddenly changes.  You become very dizzy.  You lose your balance. Summary  Eustachian tube dysfunction refers to a condition in which a blockage develops in the eustachian tube.  It can be caused by ear infections, allergies, inhaled irritants, or abnormal growths in the nose or throat.  Symptoms include ear pain, hearing loss, or ringing in the ears.  Mild cases are treated with maneuvers to unblock the ears, such as yawning or ear popping.  Severe cases are treated with medicines. Surgery may also be done (rare). This information is not intended to replace advice given to you by your health care provider. Make sure you discuss any questions you have with your health care provider. Document Released: 05/22/2015 Document Revised: 08/15/2017 Document Reviewed: 08/15/2017 Elsevier Interactive Patient Education  2019 [employer].

## 2018-05-24 NOTE — Progress Notes (Signed)
Subjective:  Debra Mathews is a 53 y.o. female who presents for evaluation of possible sinusitis.  Symptoms include right ear pressure/pain, facial pain, fever: suspected fevers but not measured at home, post nasal drip, sinus pressure, sinus pain and cough.  Patient was seen in our office on 05/22/2018 and diagnosed with influenza-like symptoms.  Patient states since that time she has developed worsening sinus pain, sinus pressure, ear fullness, and is also developed a fever.  States she has been taking DayQuil and NyQuil with no relief.  Patient also informs she has a history of recurrent sinusitis.   High risk factors for influenza complications: none .  The following portions of the patient's history were reviewed and updated as appropriate:  allergies, current medications and past medical history.  Constitutional: positive for chills, fatigue and fevers, negative for malaise and sweats Eyes: negative Ears, nose, mouth, throat, and face: positive for nasal congestion, sore throat and right ear fullness/pressure, right-sided sinus pain/tenderness, negative for ear drainage, earaches and hoarseness Respiratory: positive for cough, negative for asthma, chronic bronchitis, dyspnea on exertion, sputum, stridor and wheezing Cardiovascular: negative Gastrointestinal: positive for decreased appetite, negative for abdominal pain, diarrhea, nausea and vomiting Neurological: positive for headaches, negative for coordination problems, dizziness, gait problems, paresthesia, vertigo and weakness Allergic/Immunologic: positive for hay fever Objective:  BP 140/80 (BP Location: Right Arm, Patient Position: Sitting, Cuff Size: Large)   Pulse 95   Temp 98.6 F (37 C) (Oral)   Resp 18   Wt 290 lb (131.5 kg)   SpO2 99%   BMI 46.81 kg/m  Physical Exam Constitutional:      Appearance: She is well-developed.     Comments: Appears uncomfortable  HENT:     Head: Normocephalic.     Right Ear: Ear canal normal.  A middle ear effusion is present. Tympanic membrane is not erythematous.     Left Ear: Tympanic membrane and ear canal normal.  No middle ear effusion.     Nose: Congestion present.     Comments: Turbinates inflammed and swollen, moderate maxillary and frontal sinus tenderness-right, moderate congestion,     Mouth/Throat:     Mouth: Mucous membranes are moist.     Pharynx: No oropharyngeal exudate or uvula swelling.     Tonsils: No tonsillar exudate or tonsillar abscesses. Swelling: 0 on the right. 0 on the left.     Comments: + oropharyngeal erythema, no oropharyngeal edema, tonsils 0 bilaterally Eyes:     Conjunctiva/sclera: Conjunctivae normal.  Neck:     Musculoskeletal: Normal range of motion and neck supple.  Cardiovascular:     Rate and Rhythm: Normal rate and regular rhythm.     Heart sounds: Normal heart sounds.  Pulmonary:     Effort: Pulmonary effort is normal. No respiratory distress.     Breath sounds: Normal breath sounds. No wheezing.  Abdominal:     Palpations: Abdomen is soft.     Tenderness: There is no abdominal tenderness.  Lymphadenopathy:     Cervical: No cervical adenopathy.  Skin:    General: Skin is warm and dry.     Capillary Refill: Capillary refill takes less than 2 seconds.  Neurological:     General: No focal deficit present.     Mental Status: She is alert and oriented to person, place, and time.     Comments: No cranial nerve deficits  Psychiatric:        Mood and Affect: Mood normal.  Behavior: Behavior normal.      Assessment:  Acute Bacterial Sinusitis   Plan:   Exam findings, diagnosis etiology and medication use and indications reviewed with patient. Follow- Up and discharge instructions provided. No emergent/urgent issues found on exam.  Based on the patient's clinical presentation, symptoms, and physical assessment, patient does have symptoms congruent with sinusitis.  Although I do not feel this is a true bacterial infection,  patient informed she has a recurrent history of sinusitis.  Patient states this is normally how she feels when she does have a bacterial infection.  In this case, I will go ahead and cover the patient for bacterial infection based on the persistent fever, increasing sinus pressure and tenderness, and patient's history of recurrent sinus infections.  Also will be treated symptomatically for a right eustachian tube dysfunction with prednisone.  Patient refused steroid nasal spray so will resort to an oral steroid for a short duration.  Patient was also given Diflucan to cover her prophylactically for yeast infection because she will be on antibiotics.  Patient education was provided. Patient verbalized understanding of information provided and agrees with plan of care (POC), all questions answered. The patient is advised to call or return to clinic if condition does not see an improvement in symptoms, or to seek the care of the closest emergency department if condition worsens with the above plan.  1. Acute bacterial sinusitis  - amoxicillin-clavulanate (AUGMENTIN) 875-125 MG tablet; Take 1 tablet by mouth 2 (two) times daily for 7 days.  Dispense: 14 tablet; Refill: 0 - fluconazole (DIFLUCAN) 150 MG tablet; Take 1 tablet (150 mg total) by mouth once for 1 dose. May repeat every 72 hours up to 2 additional doses.  Dispense: 3 tablet; Refill: 0 -Take medication as prescribed. -Ibuprofen or Tylenol for pain, fever, or general discomfort. -Increase fluids. -Sleep elevated on at least 2 pillows at bedtime to help with cough. -Use a humidifier or vaporizer when at home and during sleep. -May use a teaspoon of honey or over-the-counter cough drops to help with cough. -May use normal saline nasal spray to help with nasal congestion throughout the day. -Follow-up if symptoms do not improve. -RTW note to return on May 25, 2018.  2. Acute dysfunction of right eustachian tube  - predniSONE (DELTASONE) 50 MG  tablet; Take 50mg  daily for the next 3 days at breakfast.  Dispense: 3 tablet; Refill: 0

## 2018-05-30 NOTE — Progress Notes (Deleted)
Office Visit Note  Patient: Debra Mathews             Date of Birth: 04-Sep-1965           MRN: [id number]             PCP: Maurice Small, MD Referring: Maurice Small, MD Visit Date: 06/13/2018 Occupation: @GUAROCC @  Subjective:  No chief complaint on file.   History of Present Illness: Debra Mathews is a 53 y.o. female ***   Activities of Daily Living:  Patient reports morning stiffness for *** {minute/hour:19697}.   Patient {ACTIONS;DENIES/REPORTS:21021675::"Denies"} nocturnal pain.  Difficulty dressing/grooming: {ACTIONS;DENIES/REPORTS:21021675::"Denies"} Difficulty climbing stairs: {ACTIONS;DENIES/REPORTS:21021675::"Denies"} Difficulty getting out of chair: {ACTIONS;DENIES/REPORTS:21021675::"Denies"} Difficulty using hands for taps, buttons, cutlery, and/or writing: {ACTIONS;DENIES/REPORTS:21021675::"Denies"}  No Rheumatology ROS completed.   PMFS History:  Patient Active Problem List   Diagnosis Date Noted  . Iron deficiency anemia 10/18/2011  . HTN (hypertension), benign 10/18/2011  . Menorrhagia with regular cycle 10/18/2011    Past Medical History:  Diagnosis Date  . Acne    adult  . Anxiety   . Elevated BP    no dx of HTN  . Fibroids   . HTN (hypertension), benign 10/18/2011  . Iron deficiency anemia 10/18/2011   Hb 9.1 MCV 65.8 08/24/11 hx menorrhagia/fibroids/nl GYN exam  . Menorrhagia with regular cycle 10/18/2011  . Obesity   . Sinus tachycardia     Family History  Problem Relation Age of Onset  . Osteoarthritis Mother   . COPD Mother   . Diabetes Mother   . Hypertension Mother   . Cancer - Lung Mother   . Cancer - Colon Mother   . Cancer Mother        breast   . Osteoarthritis Father   . Diabetes Brother   . Hypertension Brother   . Raynaud syndrome Daughter    Past Surgical History:  Procedure Laterality Date  . APPENDECTOMY    . MYOMECTOMY    . OVARIAN CYST REMOVAL    . ROOT CANAL  2018   Social History   Social History Narrative  . Not  on file    There is no immunization history on file for this patient.   Objective: Vital Signs: There were no vitals taken for this visit.   Physical Exam   Musculoskeletal Exam: ***  CDAI Exam: CDAI Score: Not documented Patient Global Assessment: Not documented; Provider Global Assessment: Not documented Swollen: Not documented; Tender: Not documented Joint Exam   Not documented   There is currently no information documented on the homunculus. Go to the Rheumatology activity and complete the homunculus joint exam.  Investigation: No additional findings.  Imaging: No results found.  Recent Labs: Lab Results  Component Value Date   WBC 9.9 10/28/2017   HGB 9.8 (L) 10/28/2017   PLT 386 10/28/2017   NA 139 10/28/2017   K 3.7 10/28/2017   CL 107 10/28/2017   CO2 24 10/28/2017   GLUCOSE 133 (H) 10/28/2017   BUN 11 10/28/2017   CREATININE 1.05 (H) 10/28/2017   BILITOT 1.1 10/28/2017   ALKPHOS 87 10/28/2017   AST 104 (H) 10/28/2017   ALT 34 10/28/2017   PROT 6.8 10/28/2017   ALBUMIN 3.6 10/28/2017   CALCIUM 10.5 (H) 10/28/2017   GFRAA >60 10/28/2017    Speciality Comments: No specialty comments available.  Procedures:  No procedures performed Allergies: Patient has no known allergies.   Assessment / Plan:     Visit Diagnoses: No  diagnosis found.   Orders: No orders of the defined types were placed in this encounter.  No orders of the defined types were placed in this encounter.   Face-to-face time spent with patient was *** minutes. Greater than 50% of time was spent in counseling and coordination of care.  Follow-Up Instructions: No follow-ups on file.   Earnestine Mealing, CMA  Note - This record has been created using Editor, commissioning.  Chart creation errors have been sought, but may not always  have been located. Such creation errors do not reflect on  the standard of medical care.

## 2018-06-13 ENCOUNTER — Ambulatory Visit: Payer: 59 | Admitting: Rheumatology

## 2018-06-19 MED FILL — VYVANSE 70 MG CAPSULE: 70 | 30 days supply | Qty: 30 | Fill #0

## 2018-06-27 ENCOUNTER — Encounter: Payer: Self-pay | Admitting: Family Medicine

## 2018-06-27 ENCOUNTER — Ambulatory Visit (INDEPENDENT_AMBULATORY_CARE_PROVIDER_SITE_OTHER): Payer: Self-pay | Admitting: Family Medicine

## 2018-06-27 DIAGNOSIS — R6889 Other general symptoms and signs: Secondary | ICD-10-CM

## 2018-06-27 DIAGNOSIS — J069 Acute upper respiratory infection, unspecified: Secondary | ICD-10-CM

## 2018-06-27 DIAGNOSIS — B9789 Other viral agents as the cause of diseases classified elsewhere: Secondary | ICD-10-CM

## 2018-06-27 DIAGNOSIS — G44209 Tension-type headache, unspecified, not intractable: Secondary | ICD-10-CM

## 2018-06-27 LAB — POCT INFLUENZA A/B
Influenza A, POC: NEGATIVE
Influenza B, POC: NEGATIVE

## 2018-06-27 MED ORDER — BENZONATATE 100 MG PO CAPS: 100.0000 mg | ORAL_CAPSULE | Freq: Three times a day (TID) | ORAL | 0 refills | Status: DC | PRN

## 2018-06-27 MED ORDER — BALOXAVIR MARBOXIL(80 MG DOSE) 2 X 40 MG PO TBPK: 80.0000 mg | ORAL_TABLET | Freq: Once | ORAL | 0 refills | Status: AC

## 2018-06-27 MED ORDER — AZELASTINE HCL 0.1 % NA SOLN: 1.0000 | Freq: Two times a day (BID) | NASAL | 0 refills | Status: DC

## 2018-06-27 MED ORDER — PROMETHAZINE HCL 25 MG PO TABS: 25.0000 mg | ORAL_TABLET | Freq: Three times a day (TID) | ORAL | 0 refills | Status: DC | PRN

## 2018-06-27 MED FILL — XOFLUZA 40 (2) MG TBPK: 2 X 40 | 1 days supply | Qty: 2 | Fill #0

## 2018-06-27 MED FILL — AZELASTINE HCL 137 MCG SPRY: 0.1 | 30 days supply | Qty: 30 | Fill #0

## 2018-06-27 MED FILL — BENZONATATE 100 MG CAPS: 100 | 10 days supply | Qty: 30 | Fill #0

## 2018-06-27 MED FILL — PROMETHAZINE 25 MG TABLET: 25 | 7 days supply | Qty: 20 | Fill #0

## 2018-06-27 NOTE — Progress Notes (Signed)
Debra Mathews is a 53 y.o. female who presents today with 3 days of fever, chills, headache and body aches she reports initially having symptoms 5 days ago with the only evidence of symptoms of a swollen lymphnode. 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 Of note she was seen in the last month for similar symptoms and treated with antibiotics. She reports a type of arthritis that is being worked up but disclosed that her PCP considers her immunocompromised. She has used a plethora of over the counter medications to treat her symptoms mucinex, alkaseltzer, BC cold and sinus and xyzal nightly for allergies. She reports that this medication has only mildly relieved her symptoms. She has brought in a paper towel with green and blood streaked nasal secretions for the provider to observe. She additionally reports non compliance with nasal spray flonase for seasonal allergies.   Review of Systems  Constitutional: Positive for chills and malaise/fatigue. Negative for fever.  HENT: Positive for congestion. Negative for ear discharge, ear pain, hearing loss, sinus pain, sore throat and tinnitus.   Eyes: Negative.  Negative for pain, discharge and redness.  Respiratory: Positive for cough and sputum production. Negative for shortness of breath.   Cardiovascular: Negative.  Negative for chest pain.  Gastrointestinal: Negative for abdominal pain, diarrhea, nausea and vomiting.  Genitourinary: Negative for dysuria, frequency, hematuria and urgency.  Musculoskeletal: Positive for myalgias.  Skin: Negative.  Negative for itching and rash.  Neurological: Positive for headaches. Negative for dizziness.  Endo/Heme/Allergies: Negative.   Psychiatric/Behavioral: Negative.     Debra Mathews has a current medication list which includes the following prescription(s): alprazolam, amlodipine, levocetirizine, valsartan, venlafaxine, vyvanse,  albuterol, amoxicillin, diclofenac, ibuprofen, loratadine, montelukast, and ondansetron. Also has No Known Allergies.  Debra Mathews  has a past medical history of Acne, Anxiety, Elevated BP, Fibroids, HTN (hypertension), benign (10/18/2011), Iron deficiency anemia (10/18/2011), Menorrhagia with regular cycle (10/18/2011), Obesity, and Sinus tachycardia. Also  has a past surgical history that includes Appendectomy; Myomectomy; Ovarian cyst removal; and Root canal (2018).    O: Vitals:   06/27/18 1227  BP: 130/80  Pulse: (!) 105  Resp: 16  Temp: 99.1 F (37.3 C)  SpO2: 100%     Physical Exam Vitals signs (afebrile;tachycardic) reviewed.  Constitutional:      General: She is not in acute distress.    Appearance: Normal appearance. She is well-developed. She is obese. She is not ill-appearing, toxic-appearing or diaphoretic.  HENT:     Head: Normocephalic.     Right Ear: Hearing, ear canal and external ear normal. No tenderness. No middle ear effusion.     Left Ear: Hearing, tympanic membrane, ear canal and external ear normal. No tenderness.     Ears:     Comments: Bilateral TM have mucoid appearance but flat not bulging- with good light reflex and no observed erythema    Nose: Congestion and rhinorrhea present.     Right Turbinates: Enlarged.     Left Turbinates: Enlarged.     Right Sinus: Frontal sinus tenderness present. No maxillary sinus tenderness.     Left Sinus: Frontal sinus tenderness present. No maxillary sinus tenderness.     Mouth/Throat:     Lips: Pink.     Mouth: Mucous membranes are moist.     Pharynx: Uvula midline. No pharyngeal swelling, oropharyngeal exudate, posterior oropharyngeal erythema or uvula swelling.     Tonsils: No tonsillar exudate  or tonsillar abscesses. Swelling: 1+ on the right. 1+ on the left.  Eyes:     General: No visual field deficit.    Extraocular Movements: Extraocular movements intact.     Pupils: Pupils are equal, round, and reactive to light.   Neck:     Musculoskeletal: Normal range of motion and neck supple.  Cardiovascular:     Rate and Rhythm: Regular rhythm. Tachycardia present.     Pulses: Normal pulses.     Heart sounds: Normal heart sounds. No murmur. No friction rub. No gallop.   Pulmonary:     Effort: Pulmonary effort is normal.     Breath sounds: Normal breath sounds. No transmitted upper airway sounds. No decreased breath sounds, wheezing, rhonchi or rales.  Abdominal:     General: Bowel sounds are normal.     Palpations: Abdomen is soft.  Musculoskeletal: Normal range of motion.  Lymphadenopathy:     Head:     Right side of head: No submental or submandibular adenopathy.     Left side of head: No submental or submandibular adenopathy.     Cervical: No cervical adenopathy.  Skin:    General: Skin is warm.  Neurological:     Mental Status: She is alert and oriented to person, place, and time.     Cranial Nerves: Cranial nerves are intact. No cranial nerve deficit, dysarthria or facial asymmetry.     Motor: No weakness.     Gait: Gait is intact.  Psychiatric:        Mood and Affect: Mood normal.        Behavior: Behavior is cooperative.    A: 1. Viral URI with cough   2. Acute non intractable tension-type headache   3. Flu-like symptoms    P: PLAN< Discontinue all over the counter cough/cold medications for the time being  Take medications provided today as prescribed Headache: Tylenol 650 mg every 6 hours for the next 48 hours, promethazine 12.5-25 mg every 8 hours, rest, regular meals URI: cough- tessalon perles 1-2 capsules swallowed whole every 8 hours with full glass of water Potential influenza: Xofluza x 1 today  1. Viral URI with cough - benzonatate (TESSALON) 100 MG capsule; Take 1 capsule (100 mg total) by mouth 3 (three) times daily as needed for cough (with full glass of water). - azelastine (ASTELIN) 0.1 % nasal spray; Place 1 spray into both nostrils 2 (two) times daily. Use in each  nostril as directed  2. Acute non intractable tension-type headache Patient has not been taking consistent or therapeutic doses of any medication to treat this condition. Suspect headache is viral related to symptoms- no eye exam or headache red flags normal eye exam in clinic- denies this is related to the viral - promethazine (PHENERGAN) 25 MG tablet; Take 1 tablet (25 mg total) by mouth every 8 (eight) hours as needed for nausea or vomiting.  3. Flu-like symptoms 2nd episode of patient flu like symptoms in the last 30 days- previously declined antiviral due to severe GI symptoms discussed with patient that this medication has less GI upset and decreasing viral load quicker- will treat since systemic symptoms began in last 48-72 hours with complaint of lymphnode enlargement as the only symptoms present for 5 days. - POCT Influenza A/B Results for orders placed or performed in visit on 06/27/18 (from the past 24 hour(s))  POCT Influenza A/B     Status: None   Collection Time: 06/27/18 12:45 PM  Result Value Ref  Range   Influenza A, POC Negative Negative   Influenza B, POC Negative Negative   - Baloxavir Marboxil,80 MG Dose, 2 x 40 MG TBPK; Take 80 mg by mouth once for 1 dose.    Discussed with patient exam findings, suspected diagnosis etiology and  reviewed recommended treatment plan and follow up, including complications and indications for urgent medical follow up and evaluation. Medications including use and indications reviewed with patient. Patient provided relevant patient education on diagnosis and/or relevant related condition that were discussed and reviewed with patient at discharge. Patient verbalized understanding of information provided and agrees with plan of care (POC), all questions answered.

## 2018-06-27 NOTE — Patient Instructions (Addendum)
PLAN< Discontinue all over the counter cough/cold medications for the time being  Take medications provided today as prescribed Headache: Tylenol 650 mg every 6 hours for the next 48 hours, promethazine 12.5-25 mg every 8 hours, rest, regular meals URI: cough- tessalon perles 1-2 capsules swallowed whole every 8 hours with full glass of water Potential influenza: Xofluza x 1 today   Upper Respiratory Infection, Adult An upper respiratory infection (URI) affects the nose, throat, and upper air passages. URIs are caused by germs (viruses). The most common type of URI is often called "the common cold." Medicines cannot cure URIs, but you can do things at home to relieve your symptoms. URIs usually get better within 7-10 days. Follow these instructions at home: Activity  Rest as needed.  If you have a fever, stay home from work or school until your fever is gone, or until your doctor says you may return to work or school. ? You should stay home until you cannot spread the infection anymore (you are not contagious). ? Your doctor may have you wear a face mask so you have less risk of spreading the infection. Relieving symptoms  Gargle with a salt-water mixture 3-4 times a day or as needed. To make a salt-water mixture, completely dissolve -1 tsp of salt in 1 cup of warm water.  Use a cool-mist humidifier to add moisture to the air. This can help you breathe more easily. Eating and drinking   Drink enough fluid to keep your pee (urine) pale yellow.  Eat soups and other clear broths. General instructions   Take over-the-counter and prescription medicines only as told by your doctor. These include cold medicines, fever reducers, and cough suppressants.  Do not use any products that contain nicotine or tobacco. These include cigarettes and e-cigarettes. If you need help quitting, ask your doctor.  Avoid being where people are smoking (avoid secondhand smoke).  Make sure you get regular  shots and get the flu shot every year.  Keep all follow-up visits as told by your doctor. This is important. How to avoid spreading infection to others   Wash your hands often with soap and water. If you do not have soap and water, use hand sanitizer.  Avoid touching your mouth, face, eyes, or nose.  Cough or sneeze into a tissue or your sleeve or elbow. Do not cough or sneeze into your hand or into the air. Contact a doctor if:  You are getting worse, not better.  You have any of these: ? A fever. ? Chills. ? Brown or red mucus in your nose. ? Yellow or brown fluid (discharge)coming from your nose. ? Pain in your face, especially when you bend forward. ? Swollen neck glands. ? Pain with swallowing. ? White areas in the back of your throat. Get help right away if:  You have shortness of breath that gets worse.  You have very bad or constant: ? Headache. ? Ear pain. ? Pain in your forehead, behind your eyes, and over your cheekbones (sinus pain). ? Chest pain.  You have long-lasting (chronic) lung disease along with any of these: ? Wheezing. ? Long-lasting cough. ? Coughing up blood. ? A change in your usual mucus.  You have a stiff neck.  You have changes in your: ? Vision. ? Hearing. ? Thinking. ? Mood. Summary  An upper respiratory infection (URI) is caused by a germ called a virus. The most common type of URI is often called "the common cold."  URIs  usually get better within 7-10 days.  Take over-the-counter and prescription medicines only as told by your doctor. This information is not intended to replace advice given to you by your health care provider. Make sure you discuss any questions you have with your health care provider. Document Released: 10/12/2007 Document Revised: 12/16/2016 Document Reviewed: 12/16/2016 Elsevier Interactive Patient Education  2019 [employer] Tension Headache, Adult A tension headache is pain, pressure, or aching in your  head. Tension headaches can last from 30 minutes to several days. Follow these instructions at home: Managing pain  Take over-the-counter and prescription medicines only as told by your doctor.  When you have a headache, lie down in a dark, quiet room.  If told, put ice on your head and neck: ? Put ice in a plastic bag. ? Place a towel between your skin and the bag. ? Leave the ice on for 20 minutes, 2-3 times a day.  If told, put heat on the back of your neck. Do this as often as your doctor tells you to. Use the kind of heat that your doctor recommends, such as a moist heat pack or a heating pad. ? Place a towel between your skin and the heat. ? Leave the heat on for 20-30 minutes. ? Remove the heat if your skin turns bright red. Eating and drinking  Eat meals on a regular schedule.  Watch how much alcohol you drink: ? If you are a woman and are not pregnant, do not drink more than 1 drink a day. ? If you are a man, do not drink more than 2 drinks a day.  Drink enough fluid to keep your pee (urine) pale yellow.  Do not use a lot of caffeine, or stop using caffeine. Lifestyle  Get enough sleep. Get 7-9 hours of sleep each night. Or get the amount of sleep that your doctor tells you to.  At bedtime, remove all electronic devices from your room. Examples of electronic devices are computers, phones, and tablets.  Find ways to lessen your stress. Some things that can lessen stress are: ? Exercise. ? Deep breathing. ? Yoga. ? Music. ? Positive thoughts.  Sit up straight. Do not tighten (tense) your muscles.  Do not use any products that have nicotine or tobacco in them, such as cigarettes and e-cigarettes. If you need help quitting, ask your doctor. General instructions   Keep all follow-up visits as told by your doctor. This is important.  Avoid things that can bring on headaches. Keep a journal to find out if certain things bring on headaches. For example, write  down: ? What you eat and drink. ? How much sleep you get. ? Any change to your diet or medicines. Contact a doctor if:  Your headache does not get better.  Your headache comes back.  You have a headache and sounds, light, or smells bother you.  You feel sick to your stomach (nauseous) or you throw up (vomit).  Your stomach hurts. Get help right away if:  You suddenly get a very bad headache along with any of these: ? A stiff neck. ? Feeling sick to your stomach. ? Throwing up. ? Feeling weak. ? Trouble seeing. ? Feeling short of breath. ? A rash. ? Feeling unusually sleepy. ? Trouble speaking. ? Pain in your eye or ear. ? Trouble walking or balancing. ? Feeling like you will pass out (faint). ? Passing out. Summary  A tension headache is pain, pressure, or aching in your  head.  Tension headaches can last from 30 minutes to several days.  Lifestyle changes and medicines may help relieve pain. This information is not intended to replace advice given to you by your health care provider. Make sure you discuss any questions you have with your health care provider. Document Released: 07/20/2009 Document Revised: 08/05/2016 Document Reviewed: 08/05/2016 Elsevier Interactive Patient Education  2019 [employer].

## 2018-06-29 ENCOUNTER — Telehealth: Payer: Self-pay

## 2018-06-29 NOTE — Telephone Encounter (Signed)
Patient states her headache started today again, she still have the body aches and sinus pressure and drainage. She asked if the provider can prescribe antibiotics and after the provider check her chart, encourage the patient to be evaluated by her PCP or an urgent care were they can do labs, as she has not improved and this patient was treated here ta 05/24/2018 with antibiotics and not improvement.

## 2018-07-01 ENCOUNTER — Encounter (HOSPITAL_COMMUNITY): Payer: Self-pay | Admitting: Emergency Medicine

## 2018-07-01 ENCOUNTER — Ambulatory Visit (HOSPITAL_COMMUNITY)
Admission: EM | Admit: 2018-07-01 | Discharge: 2018-07-01 | Disposition: A | Discharge status: Home / Self Care | Payer: 59 | Attending: Internal Medicine | Admitting: Internal Medicine

## 2018-07-01 DIAGNOSIS — J0101 Acute recurrent maxillary sinusitis: Secondary | ICD-10-CM

## 2018-07-01 MED ORDER — FLUTICASONE PROPIONATE 50 MCG/ACT NA SUSP: 1.0000 | Freq: Every day | NASAL | 2 refills | Status: DC

## 2018-07-01 MED ORDER — SALINE SPRAY 0.65 % NA SOLN: 1.0000 | NASAL | 0 refills | Status: DC | PRN

## 2018-07-01 MED ORDER — AMOXICILLIN-POT CLAVULANATE 875-125 MG PO TABS: 1.0000 | ORAL_TABLET | Freq: Two times a day (BID) | ORAL | 0 refills | Status: AC

## 2018-07-01 NOTE — ED Provider Notes (Signed)
[facility]    CSN: [id number] Arrival date & time: 07/01/18  1735     History   Chief Complaint Chief Complaint  Patient presents with  . Otalgia  . URI    HPI Debra Mathews is a 53 y.o. female with history of sinusitis comes to the urgent care department with complaints of facial pain, yellowish foul-smelling nasal discharge and change in voice of over 2 weeks duration.  Nasal congestion have gotten progressively worse.  No relieving factors.  Patient denies any aggravating factors.  It is associated with facial pain and change in her voice.  No fever or chills.  No blurry vision.  Patient has been treated for sinusitis with 5-day antibiotics a few weeks ago with transient relief.  Symptoms recurred after antibiotic treatment was completed. HPI  Past Medical History:  Diagnosis Date  . Acne    adult  . Anxiety   . Elevated BP    no dx of HTN  . Fibroids   . HTN (hypertension), benign 10/18/2011  . Iron deficiency anemia 10/18/2011   Hb 9.1 MCV 65.8 08/24/11 hx menorrhagia/fibroids/nl GYN exam  . Menorrhagia with regular cycle 10/18/2011  . Obesity   . Sinus tachycardia     Patient Active Problem List   Diagnosis Date Noted  . Iron deficiency anemia 10/18/2011  . HTN (hypertension), benign 10/18/2011  . Menorrhagia with regular cycle 10/18/2011    Past Surgical History:  Procedure Laterality Date  . APPENDECTOMY    . MYOMECTOMY    . OVARIAN CYST REMOVAL    . ROOT CANAL  2018    OB History   No obstetric history on file.      Home Medications    Prior to Admission medications   Medication Sig Start Date End Date Taking? Authorizing Provider  ALPRAZolam Duanne Moron) 0.5 MG tablet Take 0.5 mg by mouth at bedtime as needed for anxiety.    [provider]  amLODipine (NORVASC) 5 MG tablet Take 5 mg by mouth daily.    [provider]  amoxicillin-clavulanate (AUGMENTIN) 875-125 MG tablet Take 1 tablet by mouth every 12 (twelve) hours for  10 days. 07/01/18 07/11/18  Chase Picket, MD  azelastine (ASTELIN) 0.1 % nasal spray Place 1 spray into both nostrils 2 (two) times daily. Use in each nostril as directed 06/27/18   Shella Maxim, NP  benzonatate (TESSALON) 100 MG capsule Take 1 capsule (100 mg total) by mouth 3 (three) times daily as needed for cough (with full glass of water). 06/27/18   Shella Maxim, NP  diclofenac (FLECTOR) 1.3 % PT[facility] APPLY 1 PATCH TO SKIN TWICE A DAY EVERY 12 HOURS AS NEEDED FOR PAIN 08/24/17   [provider]  fluticasone (FLONASE) 50 MCG/ACT nasal spray Place 1 spray into both nostrils daily. 07/01/18   Chase Picket, MD  levocetirizine (XYZAL) 5 MG tablet every evening. 11/08/17   [provider]  loratadine (CLARITIN) 10 MG tablet Take 10 mg by mouth as needed for allergies.     [provider]  promethazine (PHENERGAN) 25 MG tablet Take 1 tablet (25 mg total) by mouth every 8 (eight) hours as needed for nausea or vomiting. 06/27/18   Shella Maxim, NP  sodium chloride (OCEAN) 0.65 % SOLN nasal spray Place 1 spray into both nostrils as needed for congestion. 07/01/18   Chase Picket, MD  valsartan (DIOVAN) 160 MG tablet Take 160 mg by mouth daily.    [provider]  venlafaxine (EFFEXOR) 75 MG tablet Take 75 mg by mouth daily.    [provider]  VYVANSE 70 MG capsule Take 70 mg by mouth every morning. 08/23/17   [provider]    Family History Family History  Problem Relation Age of Onset  . Osteoarthritis Mother   . COPD Mother   . Diabetes Mother   . Hypertension Mother   . Cancer - Lung Mother   . Cancer - Colon Mother   . Cancer Mother        breast   . Osteoarthritis Father   . Diabetes Brother   . Hypertension Brother   . Raynaud syndrome Daughter     Social History Social History   Tobacco Use  . Smoking status: Never Smoker  . Smokeless tobacco: Never Used  Substance Use Topics  . Alcohol use: No    Frequency: Never    . Drug use: No     Allergies   Patient has no known allergies.   Review of Systems Review of Systems  Constitutional: Positive for activity change. Negative for appetite change, chills, fatigue and fever.  HENT: Positive for congestion, postnasal drip, rhinorrhea, sinus pressure and sinus pain. Negative for ear discharge, ear pain, facial swelling, sneezing and sore throat.   Eyes: Positive for itching. Negative for photophobia, pain, discharge, redness and visual disturbance.  Respiratory: Negative for cough, shortness of breath and wheezing.   Cardiovascular: Negative for chest pain.  Gastrointestinal: Negative for abdominal distention and abdominal pain.  Genitourinary: Negative for dysuria.  Musculoskeletal: Negative for arthralgias, neck pain and neck stiffness.  Allergic/Immunologic: Negative for environmental allergies and food allergies.  Neurological: Negative for dizziness, numbness and headaches.  Hematological: Negative for adenopathy.  Psychiatric/Behavioral: Negative for agitation and confusion.     Physical Exam Triage Vital Signs ED Triage Vitals  Enc Vitals Group     BP 07/01/18 1815 (!) 151/80     Pulse Rate 07/01/18 1815 (!) 104     Resp 07/01/18 1815 20     Temp 07/01/18 1815 98.9 F (37.2 C)     Temp Source 07/01/18 1815 Oral     SpO2 07/01/18 1815 97 %     Weight --      Height --      Head Circumference --      Peak Flow --      Pain Score 07/01/18 1816 2     Pain Loc --      Pain Edu? --      Excl. in [place]? --    No data found.  Updated Vital Signs BP (!) 151/80 (BP Location: Right Arm)   Pulse (!) 104   Temp 98.9 F (37.2 C) (Oral)   Resp 20   SpO2 97%   Visual Acuity Right Eye Distance:   Left Eye Distance:   Bilateral Distance:    Right Eye Near:   Left Eye Near:    Bilateral Near:     Physical Exam Constitutional:      General: She is not in acute distress.    Appearance: Normal appearance. She is ill-appearing.  HENT:      Right Ear: Tympanic membrane normal.     Left Ear: Tympanic membrane normal.     Nose: Congestion and rhinorrhea present.     Mouth/Throat:     Mouth: Mucous membranes are moist.     Pharynx: Posterior oropharyngeal erythema present. No oropharyngeal exudate.  Eyes:     Conjunctiva/sclera:  Conjunctivae normal.  Neck:     Musculoskeletal: Normal range of motion. No neck rigidity.  Cardiovascular:     Rate and Rhythm: Normal rate and regular rhythm.     Pulses: Normal pulses.     Heart sounds: Normal heart sounds.  Pulmonary:     Effort: Pulmonary effort is normal.     Breath sounds: Normal breath sounds.  Abdominal:     General: Abdomen is flat.     Palpations: Abdomen is soft.  Musculoskeletal: Normal range of motion.  Lymphadenopathy:     Cervical: No cervical adenopathy.  Skin:    General: Skin is warm and dry.     Capillary Refill: Capillary refill takes less than 2 seconds.  Neurological:     General: No focal deficit present.     Mental Status: She is alert and oriented to person, place, and time.  Psychiatric:        Mood and Affect: Mood normal.      UC Treatments / Results  Labs (all labs ordered are listed, but only abnormal results are displayed) Labs Reviewed - No data to display  EKG None  Radiology No results found.  Procedures Procedures (including critical care time)  Medications Ordered in UC Medications - No data to display  Initial Impression / Assessment and Plan / UC Course  I have reviewed the triage vital signs and the nursing notes.  Pertinent labs & imaging results that were available during my care of the patient were reviewed by me and considered in my medical decision making (see chart for details).     1.  Acute recurrent maxillary sinusitis: Augmentin 875-125 mg 1 tablet twice daily for 10 days Saline nasal spray to be used as needed Patient to clean her CPAP tubing Continue Flonase use If no improvement, patient may need  ENT referral Final Clinical Impressions(s) / UC Diagnoses   Final diagnoses:  Acute recurrent maxillary sinusitis   Discharge Instructions   None    ED Prescriptions    Medication Sig Dispense Auth. Provider   amoxicillin-clavulanate (AUGMENTIN) 875-125 MG tablet Take 1 tablet by mouth every 12 (twelve) hours for 10 days. 20 tablet , Myrene Galas, MD   sodium chloride (OCEAN) 0.65 % SOLN nasal spray Place 1 spray into both nostrils as needed for congestion.  Chase Picket, MD   fluticasone (FLONASE) 50 MCG/ACT nasal spray Place 1 spray into both nostrils daily.  Chase Picket, MD     Controlled Substance Prescriptions  Controlled Substance Registry consulted? No   Chase Picket, MD 07/03/18 [phone]

## 2018-07-01 NOTE — ED Triage Notes (Signed)
Pt sts URI sx and right ear pain

## 2018-07-18 MED FILL — AMLODIPINE BESYLATE 5 MG TA: 5 | 90 days supply | Qty: 180 | Fill #2

## 2018-07-24 DIAGNOSIS — H4321 Crystalline deposits in vitreous body, right eye: Secondary | ICD-10-CM | POA: Diagnosis not present

## 2018-07-24 DIAGNOSIS — H524 Presbyopia: Secondary | ICD-10-CM | POA: Diagnosis not present

## 2018-08-04 MED FILL — VALSARTAN 160 MG TABLET: 160 | 90 days supply | Qty: 90 | Fill #0

## 2018-08-23 MED FILL — LEVOCETIRIZINE 5 MG TABLET: 5 | 90 days supply | Qty: 90 | Fill #0

## 2018-08-23 MED FILL — VYVANSE 70 MG CAPSULE: 70 | 30 days supply | Qty: 30 | Fill #0

## 2018-08-23 MED FILL — VENLAFAXINE HCL ER 75 MG CA: 75 | 90 days supply | Qty: 180 | Fill #0

## 2018-08-24 MED FILL — DESVENLAFAXINE SUC ER 50 MG: 50 | 30 days supply | Qty: 30 | Fill #0

## 2018-09-17 MED FILL — DESVENLAFAXINE SUC ER 100 M: 100 | 30 days supply | Qty: 30 | Fill #0

## 2018-09-21 ENCOUNTER — Ambulatory Visit (INDEPENDENT_AMBULATORY_CARE_PROVIDER_SITE_OTHER): Payer: 59 | Admitting: Rheumatology

## 2018-09-21 ENCOUNTER — Encounter: Payer: Self-pay | Admitting: Rheumatology

## 2018-09-21 ENCOUNTER — Other Ambulatory Visit: Payer: Self-pay | Admitting: *Deleted

## 2018-09-21 ENCOUNTER — Other Ambulatory Visit: Payer: Self-pay

## 2018-09-21 VITALS — BP 141/85 | HR 98 | Resp 12 | Ht 66.0 in | Wt 298.6 lb

## 2018-09-21 DIAGNOSIS — Z8639 Personal history of other endocrine, nutritional and metabolic disease: Secondary | ICD-10-CM | POA: Diagnosis not present

## 2018-09-21 DIAGNOSIS — M19041 Primary osteoarthritis, right hand: Secondary | ICD-10-CM | POA: Diagnosis not present

## 2018-09-21 DIAGNOSIS — M170 Bilateral primary osteoarthritis of knee: Secondary | ICD-10-CM | POA: Diagnosis not present

## 2018-09-21 DIAGNOSIS — M5136 Other intervertebral disc degeneration, lumbar region: Secondary | ICD-10-CM

## 2018-09-21 DIAGNOSIS — Z862 Personal history of diseases of the blood and blood-forming organs and certain disorders involving the immune mechanism: Secondary | ICD-10-CM

## 2018-09-21 DIAGNOSIS — Z8679 Personal history of other diseases of the circulatory system: Secondary | ICD-10-CM

## 2018-09-21 DIAGNOSIS — Z79899 Other long term (current) drug therapy: Secondary | ICD-10-CM

## 2018-09-21 DIAGNOSIS — Z8659 Personal history of other mental and behavioral disorders: Secondary | ICD-10-CM

## 2018-09-21 DIAGNOSIS — E538 Deficiency of other specified B group vitamins: Secondary | ICD-10-CM

## 2018-09-21 DIAGNOSIS — Z9049 Acquired absence of other specified parts of digestive tract: Secondary | ICD-10-CM

## 2018-09-21 DIAGNOSIS — M19042 Primary osteoarthritis, left hand: Secondary | ICD-10-CM

## 2018-09-21 DIAGNOSIS — M19072 Primary osteoarthritis, left ankle and foot: Secondary | ICD-10-CM

## 2018-09-21 DIAGNOSIS — M1380 Other specified arthritis, unspecified site: Secondary | ICD-10-CM

## 2018-09-21 DIAGNOSIS — M19071 Primary osteoarthritis, right ankle and foot: Secondary | ICD-10-CM | POA: Diagnosis not present

## 2018-09-21 DIAGNOSIS — Z8269 Family history of other diseases of the musculoskeletal system and connective tissue: Secondary | ICD-10-CM

## 2018-09-21 MED ORDER — PREDNISONE 5 MG PO TABS: ORAL_TABLET | ORAL | 0 refills | Status: DC

## 2018-09-21 MED FILL — predniSONE 5 MG TABS: 5 | 16 days supply | Qty: 40 | Fill #0

## 2018-09-21 NOTE — Progress Notes (Signed)
Office Visit Note  Patient: Debra Mathews             Date of Birth: 1965/10/22           MRN: [id number]             PCP: Maurice Small, MD Referring: Maurice Small, MD Visit Date: 09/21/2018 Occupation: @GUAROCC @  Subjective:  Right middle finger pain   History of Present Illness: Debra Mathews is a 53 y.o. female with history of seronegative rheumatoid arthritis, osteoarthritis, and DDD.  She is not taking anything for the treatment of RA at this time.  She uses voltaren gel and buddy tape last night, which helped mildly.  She states when she experiences increased joint pain she develops a rash on her scalp, and she is wondering if it could possibly be psoriasis.    Activities of Daily Living:  Patient reports morning stiffness for 10 minutes.   Patient Reports nocturnal pain.  Difficulty dressing/grooming: Denies Difficulty climbing stairs: Denies Difficulty getting out of chair: Denies Difficulty using hands for taps, buttons, cutlery, and/or writing: Reports  Review of Systems  Constitutional: Negative for fatigue.  HENT: Negative for mouth sores, mouth dryness and nose dryness.   Eyes: Negative for pain, visual disturbance and dryness.  Respiratory: Negative for cough, hemoptysis, shortness of breath and difficulty breathing.   Cardiovascular: Negative for chest pain, palpitations, hypertension and swelling in legs/feet.  Gastrointestinal: Negative for blood in stool, constipation and diarrhea.  Endocrine: Negative for increased urination.  Genitourinary: Negative for painful urination.  Musculoskeletal: Positive for arthralgias, joint pain, joint swelling and morning stiffness. Negative for myalgias, muscle weakness, muscle tenderness and myalgias.  Skin: Negative for color change, pallor, rash, hair loss, nodules/bumps, skin tightness, ulcers and sensitivity to sunlight.  Allergic/Immunologic: Negative for susceptible to infections.  Neurological: Negative for dizziness,  numbness, headaches and weakness.  Hematological: Negative for swollen glands.  Psychiatric/Behavioral: Negative for depressed mood and sleep disturbance. The patient is not nervous/anxious.     PMFS History:  Patient Active Problem List   Diagnosis Date Noted  . Iron deficiency anemia 10/18/2011  . HTN (hypertension), benign 10/18/2011  . Menorrhagia with regular cycle 10/18/2011    Past Medical History:  Diagnosis Date  . Acne    adult  . Anxiety   . Elevated BP    no dx of HTN  . Fibroids   . HTN (hypertension), benign 10/18/2011  . Iron deficiency anemia 10/18/2011   Hb 9.1 MCV 65.8 08/24/11 hx menorrhagia/fibroids/nl GYN exam  . Menorrhagia with regular cycle 10/18/2011  . Obesity   . Sinus tachycardia     Family History  Problem Relation Age of Onset  . Osteoarthritis Mother   . COPD Mother   . Diabetes Mother   . Hypertension Mother   . Cancer - Lung Mother   . Cancer - Colon Mother   . Cancer Mother        breast   . Osteoarthritis Father   . Diabetes Father   . Diabetes Brother   . Hypertension Brother   . Raynaud syndrome Daughter    Past Surgical History:  Procedure Laterality Date  . APPENDECTOMY    . MYOMECTOMY    . OVARIAN CYST REMOVAL    . ROOT CANAL  2018   Social History   Social History Narrative  . Not on file    There is no immunization history on file for this patient.   Objective: Vital Signs:  BP (!) 141/85 (BP Location: Left Arm, Patient Position: Sitting, Cuff Size: Normal)   Pulse 98   Resp 12   Ht 5\' 6"  (1.676 m)   Wt 298 lb 9.6 oz (135.4 kg)   BMI 48.20 kg/m    Physical Exam Vitals signs and nursing note reviewed.  Constitutional:      Appearance: She is well-developed.  HENT:     Head: Normocephalic and atraumatic.  Eyes:     Conjunctiva/sclera: Conjunctivae normal.  Neck:     Musculoskeletal: Normal range of motion.  Cardiovascular:     Rate and Rhythm: Normal rate and regular rhythm.     Heart sounds: Normal  heart sounds.  Pulmonary:     Effort: Pulmonary effort is normal.     Breath sounds: Normal breath sounds.  Abdominal:     General: Bowel sounds are normal.     Palpations: Abdomen is soft.  Lymphadenopathy:     Cervical: No cervical adenopathy.  Skin:    General: Skin is warm and dry.     Capillary Refill: Capillary refill takes less than 2 seconds.  Neurological:     Mental Status: She is alert and oriented to person, place, and time.  Psychiatric:        Behavior: Behavior normal.      Musculoskeletal Exam: C-spine, thoracic spine, and lumbar spine good ROM.  Shoulder joints, elbow joints, wrist joints, MCPs, PIPs, and DIPs good ROM with no synovitis.  Tenderness and swelling of right 2nd MCP.   Incomplete fist formation bilaterally.  Tenderness and synovitis of left 3rd PIP joint.  Limited extension of 3rd PIP joint.  Hip joints, knee joints, ankle joints, MTPs, PIPs, and DIPs good ROM.  Left ankle joint warmth and tenderness.    CDAI Exam: CDAI Score: 5  Patient Global Assessment: 5 (mm); Provider Global Assessment: 5 (mm) Swollen: 3 ; Tender: 3  Joint Exam      Right  Left  MCP 2  Swollen Tender     PIP 3     Swollen Tender  Ankle     Swollen Tender     Investigation: No additional findings.  Imaging: No results found.  Recent Labs: Lab Results  Component Value Date   WBC 9.9 10/28/2017   HGB 9.8 (L) 10/28/2017   PLT 386 10/28/2017   NA 139 10/28/2017   K 3.7 10/28/2017   CL 107 10/28/2017   CO2 24 10/28/2017   GLUCOSE 133 (H) 10/28/2017   BUN 11 10/28/2017   CREATININE 1.05 (H) 10/28/2017   BILITOT 1.1 10/28/2017   ALKPHOS 87 10/28/2017   AST 104 (H) 10/28/2017   ALT 34 10/28/2017   PROT 6.8 10/28/2017   ALBUMIN 3.6 10/28/2017   CALCIUM 10.5 (H) 10/28/2017   GFRAA >60 10/28/2017    Speciality Comments: No specialty comments available.  Procedures:  No procedures performed Allergies: Patient has no known allergies.      Assessment / Plan:      Visit Diagnoses: Seronegative arthritis -  inflammatory, positive synovitis on [place] in the past: She presents today with left third PIP joint and right second MCP joint tenderness and synovitis.  She has left ankle warmth and tenderness on exam.  She is currently having a rheumatoid arthritis flare.  She is not on any treatment for rheumatoid arthritis at this time.  She was previously taking sulfasalazine developed some dizziness and confusion but would like to restart.  She will restart on sulfasalazine 500 mg  2 tablets twice daily.  She is due for lab work and if labs are within normal limits we will send in a prescription for sulfasalazine.  She will also start on a prednisone taper starting at 20 mg and she will reduce by 5 mg every 4 days.  She will follow-up in the office in 3 months.  Medication counseling:  Baseline Immunosuppressant Labs Chest x-ray: CXR normal on 04/22/14  Does the patient have an allergy to sulfa drugs? No Patient was counseled on the purpose, proper use, and adverse effects of sulfasalazine including risk of infection and chance of nausea, headache, and sun sensitivity.  Also discussed risk of skin rash and advised patient to stop the medication and let us know if she develops a rash. Also discussed for the potential of discoloration of the urine, sweat, or tears.  Advised patient to avoid live vaccines.  Recommend annual influenza, Pneumovax 23, Prevnar 13, and Shingrix as indicated.   Reviewed the importance of frequent labs to monitor liver, kidneys, and blood counts.  Standing orders placed and patient to return 1 month after starting therapy and then every 3 months.  Provided patient with educational materials on sulfasalazine and answered all questions.  Patient consented to sulfasalazine use, and consent will be uploaded into the media tab.   Patient dose will be 500 mg 2 tablets BID.  Prescription will be sent to pharmacy pending lab results and insurance approval.   High risk medication use: She will be restarted on sulfasalazine 500 mg 2 tablets twice daily.  Indications, contraindications, potential side effects were discussed.  All questions were addressed.  She will return this afternoon to review the handout and obtain consent.  She will be due for updated lab work prior to starting on sulfasalazine.  Primary osteoarthritis of both hands: She has been experiencing increased pain in bilateral hands.  She has limited extension, tenderness, and synovitis of the left third PIP joint.  She has tenderness and synovitis of the right second MCP joint.  She has been having increased difficulty with ADLs.  She will be starting on a prednisone taper.  Joint protection and muscle strengthening were discussed.  Primary osteoarthritis of both knees: No warmth or effusion.  She is good range of motion with no discomfort.  Primary osteoarthritis of both feet:She has left ankle joint tenderness and warmth on exam today.  She will be started on a prednisone taper starting at 20 mg and she will reduce by 5 mg every 4 days.  DDD (degenerative disc disease), lumbar: She has no discomfort at this time.  Other medical conditions are listed as follows:   History of vitamin D deficiency  History of hypertension  History of anxiety  History of anemia  Vitamin B12 deficiency  History of appendectomy  Family history of systemic lupus erythematosus   Orders: No orders of the defined types were placed in this encounter.  Meds ordered this encounter  Medications  . predniSONE (DELTASONE) 5 MG tablet    Sig: Take 4 tablets by mouth daily x4 days, 3 tablets by mouth daily x4 days, 2 tablets by mouth daily x4 days, 1 tablet by mouth daily x4 days.    Dispense:  40 tablet    Refill:  0     Follow-Up Instructions: Return for Inflammatory arthritis and osteoarthritis.   Hazel Sams PA-C  I examined and evaluated the patient with Hazel Sams PA.  She has been  having a flare of arthritis with pain  and swelling in multiple joints as described above.  She has significant synovitis on my examination today.  She has been off sulfasalazine for a while.  After side effects were reviewed she was willing to go back on sulfasalazine.  Handout was given and consent was obtained.  Once we have labs available we will start her on sulfasalazine.  Prednisone taper was also given.  The plan of care was discussed as noted above.  Bo Merino, MD  Note - This record has been created using Editor, commissioning.  Chart creation errors have been sought, but may not always  have been located. Such creation errors do not reflect on  the standard of medical care.

## 2018-09-22 MED FILL — VYVANSE 70 MG CAPSULE: 70 | 30 days supply | Qty: 30 | Fill #0

## 2018-10-12 MED FILL — DESVENLAFAXINE SUC ER 100 M: 100 | 30 days supply | Qty: 30 | Fill #1

## 2018-10-17 DIAGNOSIS — Z79899 Other long term (current) drug therapy: Secondary | ICD-10-CM | POA: Diagnosis not present

## 2018-10-17 MED FILL — AMLODIPINE BESYLATE 5 MG TA: 5 | 90 days supply | Qty: 180 | Fill #0

## 2018-10-17 MED FILL — SILVADENE 1% CREAM: 1 | 20 days supply | Qty: 50 | Fill #0

## 2018-10-19 DIAGNOSIS — Z79899 Other long term (current) drug therapy: Secondary | ICD-10-CM | POA: Diagnosis not present

## 2018-10-24 ENCOUNTER — Other Ambulatory Visit: Payer: Self-pay | Admitting: Pharmacist

## 2018-10-24 ENCOUNTER — Telehealth: Payer: Self-pay | Admitting: Rheumatology

## 2018-10-24 DIAGNOSIS — M1380 Other specified arthritis, unspecified site: Secondary | ICD-10-CM

## 2018-10-24 MED ORDER — SULFASALAZINE 500 MG PO TABS: 1000.0000 mg | ORAL_TABLET | Freq: Two times a day (BID) | ORAL | 2 refills | Status: DC

## 2018-10-24 MED FILL — sulfaSALAzine 500 MG TBEC: 500 | 30 days supply | Qty: 120 | Fill #0

## 2018-10-24 NOTE — Telephone Encounter (Signed)
Tried to return call but no anwser and no voicemail.  Las results stable and okay to resume sulfasalazine.  Prescription sent to [facility].

## 2018-10-24 NOTE — Telephone Encounter (Signed)
Patient had recent lab results faxed over, and wants to get an rx for Sulfasalazine sent to  [place]. Please call patient when sent.

## 2018-10-27 MED FILL — VYVANSE 70 MG CAPSULE: 70 | 30 days supply | Qty: 30 | Fill #0

## 2018-11-02 MED FILL — SILVADENE 1% CREAM: 1 | 20 days supply | Qty: 50 | Fill #1

## 2018-11-09 MED FILL — DESVENLAFAXINE SUC ER 100 M: 100 | 30 days supply | Qty: 30 | Fill #2

## 2018-11-16 MED FILL — VENLAFAXINE HCL ER 75 MG CA: 75 | 90 days supply | Qty: 180 | Fill #0

## 2018-11-16 MED FILL — LEVOCETIRIZINE 5 MG TABLET: 5 | 90 days supply | Qty: 90 | Fill #0

## 2018-11-26 MED FILL — DICLOFENAC EPOLAMINE 1.3 %: 1.3 | 45 days supply | Qty: 90 | Fill #0

## 2018-11-28 MED FILL — VYVANSE 70 MG CAPSULE: 70 | 30 days supply | Qty: 30 | Fill #0

## 2018-12-10 MED FILL — DESVENLAFAXINE SUC ER 100 M: 100 | 30 days supply | Qty: 30 | Fill #3

## 2019-01-09 MED FILL — VYVANSE 70 MG CAPSULE: 70 | 30 days supply | Qty: 30 | Fill #0

## 2019-01-09 MED FILL — AMLODIPINE BESYLATE 5 MG TA: 5 | 90 days supply | Qty: 180 | Fill #1

## 2019-01-09 MED FILL — DESVENLAFAXINE SUC ER 100 M: 100 | 90 days supply | Qty: 90 | Fill #0

## 2019-02-04 MED FILL — SILVADENE 1% CREAM: 1 | 20 days supply | Qty: 50 | Fill #2

## 2019-02-08 MED FILL — LEVOCETIRIZINE 5 MG TABLET: 5 | 90 days supply | Qty: 90 | Fill #1

## 2019-02-12 ENCOUNTER — Ambulatory Visit (INDEPENDENT_AMBULATORY_CARE_PROVIDER_SITE_OTHER): Payer: 59 | Admitting: Family Medicine

## 2019-02-23 ENCOUNTER — Encounter (HOSPITAL_COMMUNITY): Payer: Self-pay | Admitting: *Deleted

## 2019-02-23 ENCOUNTER — Other Ambulatory Visit: Payer: Self-pay

## 2019-02-23 ENCOUNTER — Ambulatory Visit (HOSPITAL_COMMUNITY)
Admission: EM | Admit: 2019-02-23 | Discharge: 2019-02-23 | Disposition: A | Discharge status: Home / Self Care | Payer: 59 | Attending: Family Medicine | Admitting: Family Medicine

## 2019-02-23 DIAGNOSIS — J019 Acute sinusitis, unspecified: Secondary | ICD-10-CM | POA: Diagnosis not present

## 2019-02-23 MED ORDER — AMOXICILLIN-POT CLAVULANATE 875-125 MG PO TABS: 1.0000 | ORAL_TABLET | Freq: Two times a day (BID) | ORAL | 0 refills | Status: AC

## 2019-02-23 NOTE — ED Provider Notes (Signed)
[name]    CSN: [id number] Arrival date & time: 02/23/19  1627      History   Chief Complaint Chief Complaint  Patient presents with  . Appointment    1630  . Nasal Congestion  . Otalgia    HPI Debra Mathews is a 53 y.o. female history of hypertension presenting today for evaluation of sinus congestion and drainage.  Patient states that over the past 2 weeks she has had sinus congestion as well as postnasal drainage.  Of recently she started to develop right ear pain.  She reports low-grade fevers intermittently approximately 1 week ago, but this has resolved.  She denies any known exposure to COVID.  Denies fevers chills or body aches currently.  Mild coughing.  Has also had a sore throat.  Has been taking over-the-counter medicine without relief as well as allergy medicine-Xyzal.  She works at [employer] and wellness.  HPI  Past Medical History:  Diagnosis Date  . Acne    adult  . Anxiety   . Elevated BP    no dx of HTN  . Fibroids   . HTN (hypertension), benign 10/18/2011  . Iron deficiency anemia 10/18/2011   Hb 9.1 MCV 65.8 08/24/11 hx menorrhagia/fibroids/nl GYN exam  . Menorrhagia with regular cycle 10/18/2011  . Obesity   . Sinus tachycardia     Patient Active Problem List   Diagnosis Date Noted  . Iron deficiency anemia 10/18/2011  . HTN (hypertension), benign 10/18/2011  . Menorrhagia with regular cycle 10/18/2011    Past Surgical History:  Procedure Laterality Date  . APPENDECTOMY    . MYOMECTOMY    . OVARIAN CYST REMOVAL    . ROOT CANAL  2018    OB History   No obstetric history on file.      Home Medications    Prior to Admission medications   Medication Sig Start Date End Date Taking? Authorizing Provider  amLODipine (NORVASC) 5 MG tablet Take 5 mg by mouth daily.   Yes [provider]  desvenlafaxine (PRISTIQ) 100 MG 24 hr tablet Will start next week 09/24/18 09/17/18  Yes [provider]  diclofenac  (FLECTOR) 1.3 % PTCH APPLY 1 PATCH TO SKIN TWICE A DAY EVERY 12 HOURS AS NEEDED FOR PAIN 08/24/17  Yes [provider]  levocetirizine (XYZAL) 5 MG tablet every evening. 11/08/17  Yes [provider]  sulfaSALAzine (AZULFIDINE) 500 MG tablet Take 2 tablets (1,000 mg total) by mouth 2 (two) times daily. 10/24/18  Yes Deveshwar, Abel Presto, MD  valsartan (DIOVAN) 160 MG tablet Take 160 mg by mouth daily.   Yes [provider]  VYVANSE 70 MG capsule Take 70 mg by mouth every morning. 08/23/17  Yes [provider]  ALPRAZolam Duanne Moron) 0.5 MG tablet Take 0.5 mg by mouth at bedtime as needed for anxiety.    [provider]  amoxicillin-clavulanate (AUGMENTIN) 875-125 MG tablet Take 1 tablet by mouth every 12 (twelve) hours for 7 days. 02/23/19 03/02/19  Yamaira Spinner C, PA-C  promethazine (PHENERGAN) 25 MG tablet Take 1 tablet (25 mg total) by mouth every 8 (eight) hours as needed for nausea or vomiting. 06/27/18   Shella Maxim, NP  azelastine (ASTELIN) 0.1 % nasal spray Place 1 spray into both nostrils 2 (two) times daily. Use in each nostril as directed Patient not taking: Reported on 09/21/2018 06/27/18 02/23/19  Shella Maxim, NP  fluticasone [facility]) 50 MCG/ACT nasal spray Place 1 spray into both nostrils daily.  07/01/18 02/23/19  Chase Picket, MD  loratadine (CLARITIN) 10 MG tablet Take 10 mg by mouth as needed for allergies.   02/23/19  [provider]  sodium chloride (OCEAN) 0.65 % SOLN nasal spray Place 1 spray into both nostrils as needed for congestion. Patient not taking: Reported on 09/21/2018 07/01/18 02/23/19  Chase Picket, MD  venlafaxine [facility]) 75 MG tablet Take 75 mg by mouth daily.  02/23/19  [provider]    Family History Family History  Problem Relation Age of Onset  . Osteoarthritis Mother   . COPD Mother   . Diabetes Mother   . Hypertension Mother   . Cancer - Lung Mother   . Cancer - Colon Mother   .  Cancer Mother        breast   . Osteoarthritis Father   . Diabetes Father   . Diabetes Brother   . Hypertension Brother   . Raynaud syndrome Daughter     Social History Social History   Tobacco Use  . Smoking status: Never Smoker  . Smokeless tobacco: Never Used  Substance Use Topics  . Alcohol use: No    Frequency: Never  . Drug use: No     Allergies   Patient has no known allergies.   Review of Systems Review of Systems  Constitutional: Negative for activity change, appetite change, chills, fatigue and fever.  HENT: Positive for congestion, rhinorrhea, sinus pressure and sore throat. Negative for ear pain and trouble swallowing.   Eyes: Negative for discharge and redness.  Respiratory: Positive for cough. Negative for chest tightness and shortness of breath.   Cardiovascular: Negative for chest pain.  Gastrointestinal: Negative for abdominal pain, diarrhea, nausea and vomiting.  Musculoskeletal: Negative for myalgias.  Skin: Negative for rash.  Neurological: Negative for dizziness, light-headedness and headaches.     Physical Exam Triage Vital Signs ED Triage Vitals  Enc Vitals Group     BP 02/23/19 1637 130/81     Pulse Rate 02/23/19 1637 (!) 101     Resp 02/23/19 1637 18     Temp 02/23/19 1637 97.7 F (36.5 C)     Temp Source 02/23/19 1637 Other     SpO2 02/23/19 1637 98 %     Weight --      Height --      Head Circumference --      Peak Flow --      Pain Score 02/23/19 1639 6     Pain Loc --      Pain Edu? --      Excl. in [place]? --    No data found.  Updated Vital Signs BP 130/81   Pulse (!) 101   Temp 97.7 F (36.5 C) (Other (Comment))   Resp 18   SpO2 98%   Visual Acuity Right Eye Distance:   Left Eye Distance:   Bilateral Distance:    Right Eye Near:   Left Eye Near:    Bilateral Near:     Physical Exam Vitals signs and nursing note reviewed.  Constitutional:      General: She is not in acute distress.    Appearance: She is  well-developed.  HENT:     Head: Normocephalic and atraumatic.     Ears:     Comments: Bilateral ears without tenderness to palpation of external auricle, tragus and mastoid, EAC's without erythema or swelling, TM's with good bony landmarks and cone of light. Non erythematous.  Nose:     Comments: Nasal mucosa erythematous, moderately swollen turbinates bilaterally    Mouth/Throat:     Comments: Oral mucosa pink and moist, no tonsillar enlargement or exudate. Posterior pharynx patent and nonerythematous, no uvula deviation or swelling. Normal phonation.  Eyes:     Conjunctiva/sclera: Conjunctivae normal.  Neck:     Musculoskeletal: Neck supple.  Cardiovascular:     Rate and Rhythm: Normal rate and regular rhythm.     Heart sounds: No murmur.  Pulmonary:     Effort: Pulmonary effort is normal. No respiratory distress.     Breath sounds: Normal breath sounds.     Comments: Breathing comfortably at rest, CTABL, no wheezing, rales or other adventitious sounds auscultated Abdominal:     Palpations: Abdomen is soft.     Tenderness: There is no abdominal tenderness.  Skin:    General: Skin is warm and dry.  Neurological:     Mental Status: She is alert.      UC Treatments / Results  Labs (all labs ordered are listed, but only abnormal results are displayed) Labs Reviewed - No data to display  EKG   Radiology No results found.  Procedures Procedures (including critical care time)  Medications Ordered in UC Medications - No data to display  Initial Impression / Assessment and Plan / UC Course  I have reviewed the triage vital signs and the nursing notes.  Pertinent labs & imaging results that were available during my care of the patient were reviewed by me and considered in my medical decision making (see chart for details).     Sinusitis symptoms greater than 2 weeks.  Given length of symptoms will go out and cover for bacterial sinusitis with Augmentin.  Will have  continue over-the-counter medicine and allergy medicine to further help with congestion.  Drink plenty of fluids.  Seems less likely COVID.  Discussed strict return precautions. Patient verbalized understanding and is agreeable with plan.  Final Clinical Impressions(s) / UC Diagnoses   Final diagnoses:  Acute sinusitis with symptoms > 10 days     Discharge Instructions     Begin Augmentin twice daily for 1 week Continue over the counter medicines as needed for congestion/drainage  Follow up if not resolving   ED Prescriptions    Medication Sig Dispense Auth. Provider   amoxicillin-clavulanate (AUGMENTIN) 875-125 MG tablet Take 1 tablet by mouth every 12 (twelve) hours for 7 days. 14 tablet Aj Crunkleton, Stockwell C, PA-C     PDMP not reviewed this encounter.   Janith Lima, [place] 02/23/19 1731

## 2019-02-23 NOTE — ED Triage Notes (Signed)
Reports nasal/sinus congestion a 1/5 wks with postnasal drainage.  Had "low-grade fevers" approx 1 wk ago, which have resolved.  Now having sore throat and right ear pain.

## 2019-02-23 NOTE — Discharge Instructions (Signed)
Begin Augmentin twice daily for 1 week Continue over the counter medicines as needed for congestion/drainage  Follow up if not resolving

## 2019-02-26 ENCOUNTER — Ambulatory Visit (INDEPENDENT_AMBULATORY_CARE_PROVIDER_SITE_OTHER): Payer: 59 | Admitting: Family Medicine

## 2019-03-07 ENCOUNTER — Ambulatory Visit (HOSPITAL_COMMUNITY)
Admission: EM | Admit: 2019-03-07 | Discharge: 2019-03-07 | Disposition: A | Discharge status: Home / Self Care | Payer: 59 | Attending: Urgent Care | Admitting: Urgent Care

## 2019-03-07 ENCOUNTER — Other Ambulatory Visit: Payer: Self-pay

## 2019-03-07 ENCOUNTER — Encounter (HOSPITAL_COMMUNITY): Payer: Self-pay | Admitting: Emergency Medicine

## 2019-03-07 DIAGNOSIS — H9201 Otalgia, right ear: Secondary | ICD-10-CM

## 2019-03-07 DIAGNOSIS — J0100 Acute maxillary sinusitis, unspecified: Secondary | ICD-10-CM | POA: Diagnosis not present

## 2019-03-07 DIAGNOSIS — M1990 Unspecified osteoarthritis, unspecified site: Secondary | ICD-10-CM

## 2019-03-07 DIAGNOSIS — J310 Chronic rhinitis: Secondary | ICD-10-CM

## 2019-03-07 DIAGNOSIS — M2550 Pain in unspecified joint: Secondary | ICD-10-CM

## 2019-03-07 MED ORDER — CEFDINIR 300 MG PO CAPS: 300.0000 mg | ORAL_CAPSULE | Freq: Every day | ORAL | 0 refills | Status: DC

## 2019-03-07 MED ORDER — IBUPROFEN 600 MG PO TABS: 600.0000 mg | ORAL_TABLET | Freq: Four times a day (QID) | ORAL | 0 refills | Status: DC | PRN

## 2019-03-07 MED ORDER — PREDNISONE 10 MG PO TABS: ORAL_TABLET | ORAL | 0 refills | Status: DC

## 2019-03-07 NOTE — ED Triage Notes (Signed)
PT was here 10/17 and given an antibiotic for sinus infection. PT reports facial pain, ear pain have returned.   Also complains of joint pain, history of arthritis.

## 2019-03-07 NOTE — ED Provider Notes (Signed)
MRN: [id number] DOB: 1966/03/08  Subjective:   Debra Mathews is a 53 y.o. female presenting for recheck on right ear pain.  Patient has undergone antibiotic coach for otitis media, finished Augmentin but still has intermittent right ear pain.  She has worsened and now has right-sided frontal headache and sinus pain.  She takes Xyzal very consistently.  Patient also reports multiple joint pains worse in the hands with swelling.  She has previously had this worked up and has been advised that she has inflammatory arthritis.  Typically does better with prednisone course.  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  Denies needing a COVID-19 test.  No current facility-administered medications for this encounter.   Current Outpatient Medications:  .  ALPRAZolam (XANAX) 0.5 MG tablet, Take 0.5 mg by mouth at bedtime as needed for anxiety., Disp: , Rfl:  .  amLODipine (NORVASC) 5 MG tablet, Take 5 mg by mouth daily., Disp: , Rfl:  .  desvenlafaxine (PRISTIQ) 100 MG 24 hr tablet, Will start next week 09/24/18, Disp: , Rfl:  .  diclofenac (FLECTOR) 1.3 % PTCH, APPLY 1 PATCH TO SKIN TWICE A DAY EVERY 12 HOURS AS NEEDED FOR PAIN, Disp: , Rfl: 5 .  levocetirizine (XYZAL) 5 MG tablet, every evening., Disp: , Rfl: 3 .  sulfaSALAzine (AZULFIDINE) 500 MG tablet, Take 2 tablets (1,000 mg total) by mouth 2 (two) times daily., Disp: 120 tablet, Rfl: 2 .  valsartan (DIOVAN) 160 MG tablet, Take 160 mg by mouth daily., Disp: , Rfl:  .  VYVANSE 70 MG capsule, Take 70 mg by mouth every morning., Disp: , Rfl: 0 .  promethazine (PHENERGAN) 25 MG tablet, Take 1 tablet (25 mg total) by mouth every 8 (eight) hours as needed for nausea or vomiting., Disp: 20 tablet, Rfl: 0   No Known Allergies  Past Medical History:  Diagnosis Date  . Acne    adult  . Anxiety   . Elevated BP    no dx of HTN  . Fibroids   . HTN (hypertension), benign 10/18/2011  . Iron deficiency anemia  10/18/2011   Hb 9.1 MCV 65.8 08/24/11 hx menorrhagia/fibroids/nl GYN exam  . Menorrhagia with regular cycle 10/18/2011  . Obesity   . Sinus tachycardia      Past Surgical History:  Procedure Laterality Date  . APPENDECTOMY    . MYOMECTOMY    . OVARIAN CYST REMOVAL    . ROOT CANAL  2018    ROS  Objective:   Vitals: BP 130/71   Pulse 88   Temp 98.8 F (37.1 C) (Oral)   Resp 18   SpO2 98%   Physical Exam Constitutional:      General: She is not in acute distress.    Appearance: Normal appearance. She is well-developed. She is not ill-appearing, toxic-appearing or diaphoretic.  HENT:     Head: Normocephalic and atraumatic.     Right Ear: Tympanic membrane normal.     Ears:     Comments: Mild erythema of left upper portion of TM.    Nose: Nose normal.     Mouth/Throat:     Mouth: Mucous membranes are moist.     Pharynx: Oropharynx is clear.  Eyes:     General: No scleral icterus.    Extraocular Movements: Extraocular movements intact.     Pupils: Pupils are equal, round, and reactive to light.  Cardiovascular:     Rate and Rhythm: Normal rate.  Pulmonary:     Effort: Pulmonary effort is normal.  Musculoskeletal:     Left hand: She exhibits tenderness (Multiple sites of both hands bilaterally.) and swelling (Bilateral hands, trace).       Hands:  Skin:    General: Skin is warm and dry.  Neurological:     General: No focal deficit present.     Mental Status: She is alert and oriented to person, place, and time.  Psychiatric:        Mood and Affect: Mood normal.        Behavior: Behavior normal.        Thought Content: Thought content normal.        Judgment: Judgment normal.      Assessment and Plan :   1. Acute non-recurrent maxillary sinusitis   2. Right ear pain   3. Inflammatory arthritis   4. Pain in joint, multiple sites   5. Chronic rhinitis     We will use prednisone course for inflammatory arthritis flare and also ongoing sinusitis, chronic  rhinitis.  Will also cover for infectious process with cefdinir. Counseled patient on potential for adverse effects with medications prescribed/recommended today, ER and return-to-clinic precautions discussed, patient verbalized understanding.    Jaynee Eagles, [place] 03/07/19 [id number]

## 2019-03-08 MED FILL — IBUPROFEN 600 MG TABLET: 600 | 8 days supply | Qty: 30 | Fill #0

## 2019-03-08 MED FILL — CEFDINIR 300 MG CAPSULE: 300 | 10 days supply | Qty: 20 | Fill #0

## 2019-03-08 MED FILL — predniSONE 10 MG TABS: 10 | 6 days supply | Qty: 21 | Fill #0

## 2019-03-20 DIAGNOSIS — E538 Deficiency of other specified B group vitamins: Secondary | ICD-10-CM | POA: Diagnosis not present

## 2019-03-20 DIAGNOSIS — R5383 Other fatigue: Secondary | ICD-10-CM | POA: Diagnosis not present

## 2019-03-20 DIAGNOSIS — E559 Vitamin D deficiency, unspecified: Secondary | ICD-10-CM | POA: Diagnosis not present

## 2019-03-20 DIAGNOSIS — R05 Cough: Secondary | ICD-10-CM | POA: Diagnosis not present

## 2019-03-20 DIAGNOSIS — J019 Acute sinusitis, unspecified: Secondary | ICD-10-CM | POA: Diagnosis not present

## 2019-03-20 MED FILL — DOXYCYCLINE HYCLATE 100 MG: 100 | 10 days supply | Qty: 20 | Fill #0

## 2019-04-09 MED FILL — AMLODIPINE BESYLATE 5 MG TA: 5 | 90 days supply | Qty: 180 | Fill #2

## 2019-04-16 ENCOUNTER — Other Ambulatory Visit: Payer: Self-pay | Admitting: Otolaryngology

## 2019-04-16 ENCOUNTER — Other Ambulatory Visit (HOSPITAL_COMMUNITY): Payer: Self-pay | Admitting: Otolaryngology

## 2019-04-16 DIAGNOSIS — J310 Chronic rhinitis: Secondary | ICD-10-CM | POA: Diagnosis not present

## 2019-04-16 DIAGNOSIS — J329 Chronic sinusitis, unspecified: Secondary | ICD-10-CM

## 2019-04-16 DIAGNOSIS — H6983 Other specified disorders of Eustachian tube, bilateral: Secondary | ICD-10-CM | POA: Diagnosis not present

## 2019-04-16 DIAGNOSIS — J343 Hypertrophy of nasal turbinates: Secondary | ICD-10-CM | POA: Diagnosis not present

## 2019-04-16 MED FILL — IPRATROPIUM 0.06% SPRAY: 0.06 | 20 days supply | Qty: 15 | Fill #0

## 2019-04-16 MED FILL — DESVENLAFAXINE SUC ER 100 M: 100 | 90 days supply | Qty: 90 | Fill #0

## 2019-04-16 MED FILL — AMLODIPINE BESYLATE 5 MG TA: 5 | 90 days supply | Qty: 180 | Fill #0

## 2019-04-17 ENCOUNTER — Other Ambulatory Visit (HOSPITAL_COMMUNITY): Payer: Self-pay | Admitting: Otolaryngology

## 2019-04-17 DIAGNOSIS — J329 Chronic sinusitis, unspecified: Secondary | ICD-10-CM

## 2019-04-23 ENCOUNTER — Ambulatory Visit (HOSPITAL_COMMUNITY): Payer: 59

## 2019-04-23 ENCOUNTER — Other Ambulatory Visit: Payer: Self-pay

## 2019-04-23 ENCOUNTER — Ambulatory Visit (HOSPITAL_COMMUNITY)
Admission: RE | Admit: 2019-04-23 | Discharge: 2019-04-23 | Disposition: A | Discharge status: Home / Self Care | Payer: 59 | Source: Ambulatory Visit | Attending: Otolaryngology | Admitting: Otolaryngology

## 2019-04-23 DIAGNOSIS — L218 Other seborrheic dermatitis: Secondary | ICD-10-CM | POA: Diagnosis not present

## 2019-04-23 DIAGNOSIS — J329 Chronic sinusitis, unspecified: Secondary | ICD-10-CM | POA: Diagnosis not present

## 2019-04-23 DIAGNOSIS — J343 Hypertrophy of nasal turbinates: Secondary | ICD-10-CM | POA: Diagnosis not present

## 2019-04-23 MED FILL — HYDROCORTISONE 2.5% OINT: 2.5 | 14 days supply | Qty: 28 | Fill #0

## 2019-04-26 MED FILL — VALSARTAN 160 MG TABLET: 160 | 30 days supply | Qty: 30 | Fill #0

## 2019-04-26 MED FILL — sulfaSALAzine 500 MG TBEC: 500 | 30 days supply | Qty: 120 | Fill #1

## 2019-04-30 ENCOUNTER — Telehealth: Payer: Self-pay | Admitting: Rheumatology

## 2019-04-30 MED ORDER — PREDNISONE 5 MG PO TABS: ORAL_TABLET | ORAL | 0 refills | Status: DC

## 2019-04-30 MED FILL — predniSONE 5 MG TABS: 5 | 16 days supply | Qty: 40 | Fill #0

## 2019-04-30 NOTE — Telephone Encounter (Signed)
Last visit: 09/21/2018 Next visit: 06/04/2019  Patient states she is having pain and swelling in bilateral hands, wrists, feet and toes. Patient states her hips are painful as well. Patient states she has missed doses of SSZ due to having to pick up a refill from the pharmacy today. Patient is requesting a prednisone taper.

## 2019-04-30 NOTE — Telephone Encounter (Signed)
Patient calling to see if an rx for Prednisone could be sent into [facility]. Patient is having a flare with joint pain, and swelling that started yesterday. Please call to advise.

## 2019-04-30 NOTE — Telephone Encounter (Signed)
Prednisone taper sent to [facility] outpatient pharmacy, patient is aware.

## 2019-04-30 NOTE — Telephone Encounter (Signed)
Okay to give a prednisone taper starting at 20 mg and taper by 5 mg every 4 days.

## 2019-05-11 MED FILL — LEVOCETIRIZINE 5 MG TABLET: 5 | 90 days supply | Qty: 90 | Fill #2

## 2019-05-14 DIAGNOSIS — J343 Hypertrophy of nasal turbinates: Secondary | ICD-10-CM | POA: Diagnosis not present

## 2019-05-14 DIAGNOSIS — R0982 Postnasal drip: Secondary | ICD-10-CM | POA: Diagnosis not present

## 2019-05-14 DIAGNOSIS — J310 Chronic rhinitis: Secondary | ICD-10-CM | POA: Diagnosis not present

## 2019-05-14 MED FILL — VYVANSE 70 MG CAPSULE: 70 | 90 days supply | Qty: 90 | Fill #0

## 2019-05-15 MED FILL — MONTELUKAST SOD 10 MG TAB: 10 | 30 days supply | Qty: 30 | Fill #0

## 2019-05-30 MED FILL — VALSARTAN 160 MG TABLET: 160 | 30 days supply | Qty: 30 | Fill #1

## 2019-05-30 MED FILL — IPRATROPIUM 0.06% SPRAY: 0.06 | 20 days supply | Qty: 15 | Fill #1

## 2019-06-03 ENCOUNTER — Other Ambulatory Visit: Payer: Self-pay

## 2019-06-03 ENCOUNTER — Encounter: Payer: Self-pay | Admitting: Emergency Medicine

## 2019-06-03 ENCOUNTER — Ambulatory Visit
Admission: EM | Admit: 2019-06-03 | Discharge: 2019-06-03 | Disposition: A | Discharge status: Home / Self Care | Payer: 59 | Attending: Emergency Medicine | Admitting: Emergency Medicine

## 2019-06-03 DIAGNOSIS — H1011 Acute atopic conjunctivitis, right eye: Secondary | ICD-10-CM | POA: Diagnosis not present

## 2019-06-03 MED ORDER — OLOPATADINE HCL 0.2 % OP SOLN: 1.0000 [drp] | Freq: Every day | OPHTHALMIC | 0 refills | Status: DC

## 2019-06-03 MED ORDER — LUBRICANT EYE DROPS 0.4-0.3 % OP SOLN: 1.0000 [drp] | Freq: Three times a day (TID) | OPHTHALMIC | 0 refills | Status: DC | PRN

## 2019-06-03 MED FILL — OLOPATADINE HCL 0.2 % SOLN: 0.2 | 30 days supply | Qty: 3 | Fill #0

## 2019-06-03 NOTE — ED Triage Notes (Signed)
PT presents to [facility] for assessment of right eye irritation, grittiness, and clear and sticky discharge.  C/o very mild discomfort.  Denies changes in vision.

## 2019-06-03 NOTE — Discharge Instructions (Addendum)
Use allergy eyedrop daily. May use lubricant eyedrop as needed. Hot compresses every 2-4 hours.

## 2019-06-03 NOTE — ED Provider Notes (Signed)
[facility]    CSN: [id number] Arrival date & time: 06/03/19  0903      History   Chief Complaint Chief Complaint  Patient presents with  . eye redness    HPI Debra Mathews is a 54 y.o. female with history of obesity, hypertension presenting for right eye irritation since this morning.  Endorsing gritty-like sensation as well as clear/sticky discharge.  Patient does endorse mild eye discomfort near medial canthus of affected eye that is without swelling or erythema.  Denies fever, change in vision, painful eye movements, ear pain, nose pain, sore throat.  No known change in exposures or trauma to the area.  No foreign body sensation.  Patient does wear glasses and contact lenses (though has not been wearing contact lenses for the last few days) and does have an eye doctor to follow-up with.  Patient also endorsing seasonal allergies for which she's taking xyzal.   Past Medical History:  Diagnosis Date  . Acne    adult  . Anxiety   . Elevated BP    no dx of HTN  . Fibroids   . HTN (hypertension), benign 10/18/2011  . Iron deficiency anemia 10/18/2011   Hb 9.1 MCV 65.8 08/24/11 hx menorrhagia/fibroids/nl GYN exam  . Menorrhagia with regular cycle 10/18/2011  . Obesity   . Sinus tachycardia     Patient Active Problem List   Diagnosis Date Noted  . Iron deficiency anemia 10/18/2011  . HTN (hypertension), benign 10/18/2011  . Menorrhagia with regular cycle 10/18/2011    Past Surgical History:  Procedure Laterality Date  . APPENDECTOMY    . MYOMECTOMY    . OVARIAN CYST REMOVAL    . ROOT CANAL  2018    OB History   No obstetric history on file.      Home Medications    Prior to Admission medications   Medication Sig Start Date End Date Taking? Authorizing Provider  ALPRAZolam Duanne Moron) 0.5 MG tablet Take 0.5 mg by mouth at bedtime as needed for anxiety.    [provider]  amLODipine (NORVASC) 5 MG tablet Take 5 mg by mouth daily.    [provider]  cefdinir (OMNICEF) 300 MG capsule Take 1 capsule (300 mg total) by mouth daily. 03/07/19   Jaynee Eagles, PA-C  desvenlafaxine (PRISTIQ) 100 MG 24 hr tablet Will start next week 09/24/18 09/17/18   [provider]  diclofenac (FLECTOR) 1.3 % PTCH APPLY 1 PATCH TO SKIN TWICE A DAY EVERY 12 HOURS AS NEEDED FOR PAIN 08/24/17   [provider]  ibuprofen (ADVIL) 600 MG tablet Take 1 tablet (600 mg total) by mouth every 6 (six) hours as needed. 03/07/19   Jaynee Eagles, PA-C  levocetirizine (XYZAL) 5 MG tablet every evening. 11/08/17   [provider]  Olopatadine HCl 0.2 % SOLN Apply 1 drop to eye daily. 06/03/19   Hall-Potvin, [place], PA-C  Polyethyl Glycol-Propyl Glycol (LUBRICANT EYE DROPS) 0.4-0.3 % SOLN Apply 1 drop to eye 3 (three) times daily as needed. 06/03/19   Hall-Potvin, [place], PA-C  predniSONE (DELTASONE) 10 MG tablet 6-1 taper - start with 6 pills on the 1st day and decreased each day by one pill. Take pills for that day all together in the am with food. 03/07/19   Jaynee Eagles, PA-C  predniSONE (DELTASONE) 5 MG tablet Take 4 tabs po qd x 4 days, 3  tabs po qd x 4 days, 2  tabs po qd x 4 days, 1  tab po qd x 4 days 04/30/19   Bo Merino, MD  promethazine (PHENERGAN) 25 MG tablet Take 1 tablet (25 mg total) by mouth every 8 (eight) hours as needed for nausea or vomiting. 06/27/18   Shella Maxim, NP  sulfaSALAzine (AZULFIDINE) 500 MG tablet Take 2 tablets (1,000 mg total) by mouth 2 (two) times daily. 10/24/18   Bo Merino, MD  valsartan (DIOVAN) 160 MG tablet Take 160 mg by mouth daily.    [provider]  VYVANSE 70 MG capsule Take 70 mg by mouth every morning. 08/23/17   [provider]  azelastine (ASTELIN) 0.1 % nasal spray Place 1 spray into both nostrils 2 (two) times daily. Use in each nostril as directed Patient not taking: Reported on 09/21/2018 06/27/18 02/23/19  Shella Maxim, NP  fluticasone [facility]) 50 MCG/ACT  nasal spray Place 1 spray into both nostrils daily. 07/01/18 02/23/19  Chase Picket, MD  loratadine (CLARITIN) 10 MG tablet Take 10 mg by mouth as needed for allergies.   02/23/19  [provider]  sodium chloride (OCEAN) 0.65 % SOLN nasal spray Place 1 spray into both nostrils as needed for congestion. Patient not taking: Reported on 09/21/2018 07/01/18 02/23/19  Chase Picket, MD  venlafaxine [facility]) 75 MG tablet Take 75 mg by mouth daily.  02/23/19  [provider]    Family History Family History  Problem Relation Age of Onset  . Osteoarthritis Mother   . COPD Mother   . Diabetes Mother   . Hypertension Mother   . Cancer - Lung Mother   . Cancer - Colon Mother   . Cancer Mother        breast   . Osteoarthritis Father   . Diabetes Father   . Diabetes Brother   . Hypertension Brother   . Raynaud syndrome Daughter     Social History Social History   Tobacco Use  . Smoking status: Never Smoker  . Smokeless tobacco: Never Used  Substance Use Topics  . Alcohol use: No  . Drug use: No     Allergies   Patient has no known allergies.   Review of Systems As per HPI   Physical Exam Triage Vital Signs ED Triage Vitals  Enc Vitals Group     BP      Pulse      Resp      Temp      Temp src      SpO2      Weight      Height      Head Circumference      Peak Flow      Pain Score      Pain Loc      Pain Edu?      Excl. in [place]?    No data found.  Updated Vital Signs BP 134/84 (BP Location: Left Arm)   Pulse (!) 108   Temp 97.6 F (36.4 C) (Temporal)   Resp 18   SpO2 98%   Visual Acuity Right Eye Distance:   Left Eye Distance:   Bilateral Distance:    Right Eye Near: R Near: 20/70 Left Eye Near:  L Near: 20/50 Bilateral Near:  20/50  Physical Exam Constitutional:      General: She is not in acute distress.    Appearance: Normal appearance. She is obese. She is not ill-appearing.  HENT:     Head: Normocephalic and  atraumatic.  Eyes:     General: Lids  are normal. Lids are everted, no foreign bodies appreciated. Vision grossly intact. Gaze aligned appropriately. No visual field deficit or scleral icterus.       Right eye: No foreign body, discharge or hordeolum.        Left eye: No foreign body, discharge or hordeolum.     Extraocular Movements: Extraocular movements intact.     Right eye: No nystagmus.     Left eye: No nystagmus.     Conjunctiva/sclera:     Right eye: Right conjunctiva is not injected. No chemosis, exudate or hemorrhage.    Left eye: Left conjunctiva is not injected. No chemosis, exudate or hemorrhage.    Pupils: Pupils are equal, round, and reactive to light.   Cardiovascular:     Rate and Rhythm: Normal rate.     Comments: HR 97 at bedside Pulmonary:     Effort: Pulmonary effort is normal.     Breath sounds: No wheezing.  Musculoskeletal:     Cervical back: Neck supple. No tenderness.  Lymphadenopathy:     Cervical: No cervical adenopathy.  Skin:    Findings: No bruising, erythema or rash.      UC Treatments / Results  Labs (all labs ordered are listed, but only abnormal results are displayed) Labs Reviewed - No data to display  EKG   Radiology No results found.  Procedures Procedures (including critical care time)  Medications Ordered in UC Medications - No data to display  Initial Impression / Assessment and Plan / UC Course  I have reviewed the triage vital signs and the nursing notes.  Pertinent labs & imaging results that were available during my care of the patient were reviewed by me and considered in my medical decision making (see chart for details).     Patient afebrile, appears well in office today.  Visual acuity done by nurse at time of triage: Near - Bilateral 20/50, left 20/50, right 20/70.  Patient to follow-up with her eye doctor as prescription is old per patient.  Patient mildly tachycardic (97-105 at bedside) though asymptomatic.  H&P  most consistent with allergic conjunctivitis.  Will start Pataday, use lubricant eyedrops as needed throughout day for symptom relief.  Stressed importance of hot compresses, frequent handwashing and avoiding touching eye to lower risk of developing bacterial conjunctivitis.  Return precautions discussed, patient verbalized understanding and is agreeable to plan. Final Clinical Impressions(s) / UC Diagnoses   Final diagnoses:  Allergic conjunctivitis of right eye     Discharge Instructions     Use allergy eyedrop daily. May use lubricant eyedrop as needed. Hot compresses every 2-4 hours.    ED Prescriptions    Medication Sig Dispense Auth. Provider   Olopatadine HCl 0.2 % SOLN Apply 1 drop to eye daily. 2.5 mL Hall-Potvin, [place], PA-C   Polyethyl Glycol-Propyl Glycol (LUBRICANT EYE DROPS) 0.4-0.3 % SOLN Apply 1 drop to eye 3 (three) times daily as needed. 10 mL Hall-Potvin, [place], PA-C     PDMP not reviewed this encounter.   Hall-Potvin, [place], [place] 06/03/19 1006

## 2019-06-04 ENCOUNTER — Ambulatory Visit: Payer: 59 | Admitting: Rheumatology

## 2019-06-04 NOTE — Progress Notes (Signed)
Office Visit Note  Patient: Debra Mathews             Date of Birth: 08/04/1965           MRN: [id number]             PCP: Maurice Small, MD Referring: Maurice Small, MD Visit Date: 06/11/2019 Occupation: @GUAROCC @  Subjective:  Pain in multiple joints   History of Present Illness: Debra Mathews is a 54 y.o. female with history of seronegative rheumatoid arthritis. She is taking sulfasalazine 500 mg BID.  She has not been compliant taking SSZ due to reflux.  She has tried taking several "breaks" and has tried reducing the dose of SSZ.  She has pain and swelling in both hands, left knee, and both feet.  She has been having increased difficulty climbing steps. She has been experiencing sporadic pain in both feet. She has also been having severe pain in the left heel intermittently.  She has intermittent pain and stiffness in the right SI joint. She has been experiencing worsening fatigue recently.    Activities of Daily Living:  Patient reports morning stiffness for 0 minutes.   Patient Reports nocturnal pain.  Difficulty dressing/grooming: Denies Difficulty climbing stairs: Reports Difficulty getting out of chair: Reports Difficulty using hands for taps, buttons, cutlery, and/or writing: Reports  Review of Systems  Constitutional: Positive for fatigue.  HENT: Positive for mouth dryness. Negative for mouth sores and nose dryness.   Eyes: Positive for dryness. Negative for itching.  Respiratory: Negative for shortness of breath and difficulty breathing.   Cardiovascular: Negative for chest pain and palpitations.  Gastrointestinal: Negative for blood in stool, constipation and diarrhea.  Endocrine: Negative for increased urination.  Genitourinary: Negative for difficulty urinating and painful urination.  Musculoskeletal: Positive for arthralgias, joint pain and joint swelling. Negative for morning stiffness.  Skin: Positive for rash and hair loss.  Allergic/Immunologic: Negative for  susceptible to infections.  Neurological: Positive for dizziness, headaches, memory loss and weakness. Negative for numbness.  Hematological: Negative for bruising/bleeding tendency.  Psychiatric/Behavioral: Negative for confusion and sleep disturbance. The patient is nervous/anxious.     PMFS History:  Patient Active Problem List   Diagnosis Date Noted  . Iron deficiency anemia 10/18/2011  . HTN (hypertension), benign 10/18/2011  . Menorrhagia with regular cycle 10/18/2011    Past Medical History:  Diagnosis Date  . Acne    adult  . Anxiety   . Elevated BP    no dx of HTN  . Fibroids   . HTN (hypertension), benign 10/18/2011  . Iron deficiency anemia 10/18/2011   Hb 9.1 MCV 65.8 08/24/11 hx menorrhagia/fibroids/nl GYN exam  . Menorrhagia with regular cycle 10/18/2011  . Obesity   . Sinus tachycardia     Family History  Problem Relation Age of Onset  . Osteoarthritis Mother   . COPD Mother   . Diabetes Mother   . Hypertension Mother   . Cancer - Lung Mother   . Cancer - Colon Mother   . Cancer Mother        breast   . Osteoarthritis Father   . Diabetes Father   . Diabetes Brother   . Hypertension Brother   . Stroke Brother   . Hypertension Brother   . Raynaud syndrome Daughter    Past Surgical History:  Procedure Laterality Date  . APPENDECTOMY    . MYOMECTOMY    . OVARIAN CYST REMOVAL    . ROOT CANAL  2018   Social History   Social History Narrative  . Not on file    There is no immunization history on file for this patient.   Objective: Vital Signs: BP (!) 150/90 (BP Location: Left Wrist, Patient Position: Sitting, Cuff Size: Normal)   Pulse (!) 105   Resp 16   Ht 5\' 6"  (1.676 m)   Wt (!) 311 lb (141.1 kg)   BMI 50.20 kg/m    Physical Exam Vitals and nursing note reviewed.  Constitutional:      Appearance: She is well-developed.  HENT:     Head: Normocephalic and atraumatic.  Eyes:     Conjunctiva/sclera: Conjunctivae normal.    Cardiovascular:     Rate and Rhythm: Normal rate and regular rhythm.     Heart sounds: Normal heart sounds.  Pulmonary:     Effort: Pulmonary effort is normal.     Breath sounds: Normal breath sounds.  Abdominal:     General: Bowel sounds are normal.     Palpations: Abdomen is soft.  Musculoskeletal:     Cervical back: Normal range of motion.  Lymphadenopathy:     Cervical: No cervical adenopathy.  Skin:    General: Skin is warm and dry.     Capillary Refill: Capillary refill takes less than 2 seconds.  Neurological:     Mental Status: She is alert and oriented to person, place, and time.  Psychiatric:        Behavior: Behavior normal.      Musculoskeletal Exam: C-spine, thoracic spine, and lumbar spine good ROM.  Incomplete fist formation bilaterally. Tenderness of both wrist joints noted. PIP joint synovial thickening.  Right 2nd PIP joint and left 3rd PIP joint tenderness.  Hip joint, knee joints, ankle joints, MTPs, PIPs, and DIPs good ROM with no synovitis.  No warmth or effusion of knee joints.  Tenderness of bilateral ankle joints.  Pes planus bilaterally.   CDAI Exam: CDAI Score: 5.3  Patient Global: 8 mm; Provider Global: 5 mm Swollen: 0 ; Tender: 6  Joint Exam 06/11/2019      Right  Left  Wrist   Tender   Tender  PIP 2   Tender     PIP 3      Tender  Ankle   Tender   Tender     Investigation: No additional findings.  Imaging: XR Foot 2 Views Left  Result Date: 06/11/2019 First MTP subluxation was noted.  PIP and DIP narrowing was noted.  No intertarsal or tibiotalar joint space narrowing was noted.  A small inferior and posterior calcaneal spur was noted.  Dorsal spurring was noted. Impression: These findings are consistent with osteoarthritis of the foot.  XR Foot 2 Views Right  Result Date: 06/11/2019 First MTP narrowing and subluxation was noted.  No significant MTP narrowing was noted.  Mild PIP and DIP narrowing was noted.  No intertarsal, tibiotalar or  subtalar joint space narrowing was noted.  Small inferior and posterior calcaneal spurs were noted. Impression: These findings are consistent with osteoarthritis of the foot.  XR Hand 2 View Left  Result Date: 06/11/2019 Juxta-articular osteopenia was noted.  No MCP, intercarpal or radiocarpal joint space narrowing was noted.  No erosive changes were noted.  PIP narrowing was noted.  Subluxation of third PIP was noted. Impression: These findings are consistent with osteoarthritis of the hand and juxta-articular osteopenia.  XR Hand 2 View Right  Result Date: 06/11/2019 No MCP, intercarpal radiocarpal joint space narrowing was  noted.  Mild distal articular osteopenia was noted.  Mild PIP narrowing was noted. Impression: These findings are consistent with mild osteoarthritis.  Juxta-articular osteopenia was noted.   Recent Labs: Lab Results  Component Value Date   WBC 9.9 10/28/2017   HGB 9.8 (L) 10/28/2017   PLT 386 10/28/2017   NA 139 10/28/2017   K 3.7 10/28/2017   CL 107 10/28/2017   CO2 24 10/28/2017   GLUCOSE 133 (H) 10/28/2017   BUN 11 10/28/2017   CREATININE 1.05 (H) 10/28/2017   BILITOT 1.1 10/28/2017   ALKPHOS 87 10/28/2017   AST 104 (H) 10/28/2017   ALT 34 10/28/2017   PROT 6.8 10/28/2017   ALBUMIN 3.6 10/28/2017   CALCIUM 10.5 (H) 10/28/2017   GFRAA >60 10/28/2017    Speciality Comments: No specialty comments available.  Procedures:  No procedures performed Allergies: Patient has no known allergies.   Assessment / Plan:     Visit Diagnoses: Seronegative arthritis - Inflammatory, positive synovitis on [place] in the past: She has no synovitis on exam today.  She has tenderness of bilateral wrist joints and of the right second PIP and left third PIP joint.  She has tenderness over the lateral aspect of both ankle joints.  She has been experiencing increased pain in multiple joints including bilateral hands, bilateral wrist joints, left knee joint, and both feet.  X-rays of  both hands and feet were obtained today to assess for radiographic progression.  She is currently taking sulfasalazine 500 mg 1 tablet twice daily.  She reduce the dose of sulfasalazine due to experiencing reflux.  She has been noncompliant and has taken several "breaks" and reduce the dose herself.  We discussed switching her from sulfasalazine to Plaquenil.  Indications, contraindications, potential side effects of Plaquenil were discussed.  She will start on Plaquenil 200 mg 1 tablet twice daily once lab orders have resulted.  She will require CBC and CMP today.  We will also check a sed rate, RF, anti-CCP, and 14 3 3  eta.  She will follow-up in the office in 3 months.- Plan: XR Hand 2 View Left, XR Hand 2 View Right, XR Foot 2 Views Right, XR Foot 2 Views Left, Sedimentation rate, Cyclic citrul peptide antibody, IgG, 14-3-3 eta Protein, Rheumatoid factor  Patient was counseled on the purpose, proper use, and adverse effects of hydroxychloroquine including nausea/diarrhea, skin rash, headaches, and sun sensitivity.  Discussed importance of annual eye exams while on hydroxychloroquine to monitor to ocular toxicity and discussed importance of frequent laboratory monitoring.  Provided patient with eye exam form for baseline ophthalmologic exam.  Provided patient with educational materials on hydroxychloroquine and answered all questions.  Patient consented to hydroxychloroquine.  Will upload consent in the media tab.    Dose will be Plaquenil 200 mg twice daily.  Prescription pending lab results.  High risk medication use -Plaquenil 200 mg 1 tablet twice daily.  She will require baseline Plaquenil eye exam.  CBC and CMP were drawn today.  Prescription is pending prescription results.  Standing orders placed today.- Plan: CBC with Differential/Platelet, COMPLETE METABOLIC PANEL WITH GFR  Pain in both hands -She has chronic pain and stiffness in bilateral wrist joints and bilateral hands.  No synovitis was  noted on exam today.  X-rays of both hands were updated today to assess for interval change.  Plan: XR Hand 2 View Left, XR Hand 2 View Right  Pain in both feet -She has chronic pain in both feet.  She  has tenderness on the lateral aspect of the bilateral ankle joints.  No tenderness of MTP joints noted.  X-rays of both feet were obtained today.  Plan: XR Foot 2 Views Right, XR Foot 2 Views Left  Primary osteoarthritis of both hands: She has synovial thickening of all PIP joints.  Primary osteoarthritis of both knees: She has good range of motion of both knee joints.  No warmth or effusion was noted.  She has chronic pain in the left knee joint.  She is been having increased difficulty climbing steps and getting up from a chair.  Primary osteoarthritis of both feet: She has chronic pain in both feet.  She has osteoarthritic changes in her feet.  X-rays of both feet were obtained today.  DDD (degenerative disc disease), lumbar: She has chronic lower back pain and stiffness.  She has midline spinal tenderness in the lumbar region.  She has intermittent right SI joint pain but no tenderness on exam today.  No symptoms of radiculopathy.   Other medical conditions are listed as follows:   History of vitamin D deficiency  History of hypertension  History of anxiety  History of anemia  Vitamin B12 deficiency  History of appendectomy  Family history of systemic lupus erythematosus   Orders: Orders Placed This Encounter  Procedures  . XR Hand 2 View Left  . XR Hand 2 View Right  . XR Foot 2 Views Right  . XR Foot 2 Views Left  . CBC with Differential/Platelet  . COMPLETE METABOLIC PANEL WITH GFR  . Sedimentation rate  . Cyclic citrul peptide antibody, IgG  . 14-3-3 eta Protein  . Rheumatoid factor  . CBC with Differential/Platelet  . COMPLETE METABOLIC PANEL WITH GFR   No orders of the defined types were placed in this encounter.   Face-to-face time spent with patient was 30  minutes. Greater than 50% of time was spent in counseling and coordination of care.  Follow-Up Instructions: Return in about 2 months (around 08/09/2019) for Rheumatoid arthritis.   Debra Sams, PA-C  I examined and evaluated the patient with Debra Sams PA.  She continues to have intermittent joint swelling.  No synovitis was noted on the examination today.  Although she had tenderness in multiple joints as described above.  She has not been able to tolerate sulfasalazine.  We will switch her to Plaquenil.  X-ray findings are consistent with osteoarthritis mainly.  She has bilateral pes planus and fallen arches.  She would benefit from orthotics.  The plan of care was discussed as noted above.  Bo Merino, MD  Note - This record has been created using Editor, commissioning.  Chart creation errors have been sought, but may not always  have been located. Such creation errors do not reflect on  the standard of medical care.

## 2019-06-11 ENCOUNTER — Ambulatory Visit: Payer: Self-pay

## 2019-06-11 ENCOUNTER — Other Ambulatory Visit: Payer: Self-pay | Admitting: Pharmacist

## 2019-06-11 ENCOUNTER — Ambulatory Visit (INDEPENDENT_AMBULATORY_CARE_PROVIDER_SITE_OTHER): Payer: 59 | Admitting: Rheumatology

## 2019-06-11 ENCOUNTER — Encounter: Payer: Self-pay | Admitting: Rheumatology

## 2019-06-11 ENCOUNTER — Other Ambulatory Visit: Payer: Self-pay

## 2019-06-11 VITALS — BP 150/90 | HR 105 | Resp 16 | Ht 66.0 in | Wt 311.0 lb

## 2019-06-11 DIAGNOSIS — Z8679 Personal history of other diseases of the circulatory system: Secondary | ICD-10-CM | POA: Diagnosis not present

## 2019-06-11 DIAGNOSIS — M19071 Primary osteoarthritis, right ankle and foot: Secondary | ICD-10-CM | POA: Diagnosis not present

## 2019-06-11 DIAGNOSIS — Z862 Personal history of diseases of the blood and blood-forming organs and certain disorders involving the immune mechanism: Secondary | ICD-10-CM

## 2019-06-11 DIAGNOSIS — Z8659 Personal history of other mental and behavioral disorders: Secondary | ICD-10-CM

## 2019-06-11 DIAGNOSIS — M1380 Other specified arthritis, unspecified site: Secondary | ICD-10-CM | POA: Diagnosis not present

## 2019-06-11 DIAGNOSIS — M5136 Other intervertebral disc degeneration, lumbar region: Secondary | ICD-10-CM

## 2019-06-11 DIAGNOSIS — M79642 Pain in left hand: Secondary | ICD-10-CM | POA: Diagnosis not present

## 2019-06-11 DIAGNOSIS — M19041 Primary osteoarthritis, right hand: Secondary | ICD-10-CM

## 2019-06-11 DIAGNOSIS — M79671 Pain in right foot: Secondary | ICD-10-CM

## 2019-06-11 DIAGNOSIS — Z79899 Other long term (current) drug therapy: Secondary | ICD-10-CM | POA: Diagnosis not present

## 2019-06-11 DIAGNOSIS — E538 Deficiency of other specified B group vitamins: Secondary | ICD-10-CM

## 2019-06-11 DIAGNOSIS — M170 Bilateral primary osteoarthritis of knee: Secondary | ICD-10-CM | POA: Diagnosis not present

## 2019-06-11 DIAGNOSIS — M79672 Pain in left foot: Secondary | ICD-10-CM

## 2019-06-11 DIAGNOSIS — Z8269 Family history of other diseases of the musculoskeletal system and connective tissue: Secondary | ICD-10-CM

## 2019-06-11 DIAGNOSIS — M19042 Primary osteoarthritis, left hand: Secondary | ICD-10-CM

## 2019-06-11 DIAGNOSIS — M19072 Primary osteoarthritis, left ankle and foot: Secondary | ICD-10-CM

## 2019-06-11 DIAGNOSIS — Z9049 Acquired absence of other specified parts of digestive tract: Secondary | ICD-10-CM

## 2019-06-11 DIAGNOSIS — Z8639 Personal history of other endocrine, nutritional and metabolic disease: Secondary | ICD-10-CM | POA: Diagnosis not present

## 2019-06-11 DIAGNOSIS — M79641 Pain in right hand: Secondary | ICD-10-CM

## 2019-06-11 NOTE — Progress Notes (Signed)
Pharmacy Note  Subjective: Patient presents today to [facility] for follow up office visit.   Patient seen by the pharmacist for counseling on hydroxychloroquine rheumatoid arthritis.  Prior therapy includes:sulfasalzine (GI upset/GERD).  Objective: CMP     Component Value Date/Time   NA 139 10/28/2017 0416   K 3.7 10/28/2017 0416   CL 107 10/28/2017 0416   CO2 24 10/28/2017 0416   GLUCOSE 133 (H) 10/28/2017 0416   BUN 11 10/28/2017 0416   CREATININE 1.05 (H) 10/28/2017 0416   CALCIUM 10.5 (H) 10/28/2017 0416   PROT 6.8 10/28/2017 0416   ALBUMIN 3.6 10/28/2017 0416   AST 104 (H) 10/28/2017 0416   ALT 34 10/28/2017 0416   ALKPHOS 87 10/28/2017 0416   BILITOT 1.1 10/28/2017 0416   GFRNONAA >60 10/28/2017 0416   GFRAA >60 10/28/2017 0416    CBC    Component Value Date/Time   WBC 9.9 10/28/2017 0416   RBC 5.24 (H) 10/28/2017 0416   HGB 9.8 (L) 10/28/2017 0416   HGB 9.8 (L) 10/19/2011 1537   HCT 35.7 (L) 10/28/2017 0416   HCT 33.0 (L) 10/19/2011 1537   PLT 386 10/28/2017 0416   PLT 365 10/19/2011 1537   MCV 68.1 (L) 10/28/2017 0416   MCV 71.6 (L) 10/19/2011 1537   MCH 18.7 (L) 10/28/2017 0416   MCHC 27.5 (L) 10/28/2017 0416   RDW 18.4 (H) 10/28/2017 0416   RDW 23.1 (H) 10/19/2011 1537   LYMPHSABS 2.2 10/19/2011 1537   MONOABS 0.5 10/19/2011 1537   EOSABS 0.1 10/19/2011 1537   BASOSABS 0.1 10/19/2011 1537    Assessment/Plan: Patient was counseled on the purpose, proper use, and adverse effects of hydroxychloroquine including nausea/diarrhea, skin rash, headaches, and sun sensitivity.  Discussed importance of annual eye exams while on hydroxychloroquine to monitor to ocular toxicity and discussed importance of frequent laboratory monitoring.  Provided patient with eye exam form for baseline ophthalmologic exam and standing lab instructions.  Provided patient with educational materials on hydroxychloroquine and answered all questions.  Patient consented to  hydroxychloroquine.  Will upload consent in the media tab.   Dose will be Plaquenil 200 mg twice daily.  Prescription pending lab results.  All questions encouraged and answered.  Instructed patient to call with any questions or concerns.  Mariella Saa, PharmD, [place], [place] [facility] Pharmacist [phone]  06/11/2019 3:56 PM

## 2019-06-12 ENCOUNTER — Other Ambulatory Visit: Payer: Self-pay | Admitting: *Deleted

## 2019-06-12 MED ORDER — HYDROXYCHLOROQUINE SULFATE 200 MG PO TABS: 200.0000 mg | ORAL_TABLET | Freq: Two times a day (BID) | ORAL | 0 refills | Status: DC

## 2019-06-12 MED FILL — HYDROXYCHLOROQUINE SULFATE: 200 | 90 days supply | Qty: 180 | Fill #0

## 2019-06-12 NOTE — Progress Notes (Signed)
CBC stable. CMP WNL.  ESR WNL.  RF negative.   Ok to send in prescription for plaquenil.

## 2019-06-14 LAB — COMPLETE METABOLIC PANEL WITH GFR
AG Ratio: 1.6 (calc) (ref 1.0–2.5)
ALT: 14 U/L (ref 6–29)
AST: 24 U/L (ref 10–35)
Albumin: 3.9 g/dL (ref 3.6–5.1)
Alkaline phosphatase (APISO): 95 U/L (ref 37–153)
BUN: 9 mg/dL (ref 7–25)
CO2: 27 mmol/L (ref 20–32)
Calcium: 9.8 mg/dL (ref 8.6–10.4)
Chloride: 106 mmol/L (ref 98–110)
Creat: 1.05 mg/dL (ref 0.50–1.05)
GFR, Est African American: 70 mL/min/{1.73_m2} (ref >=60)
GFR, Est Non African American: 61 mL/min/{1.73_m2} (ref >=60)
Globulin: 2.5 g/dL (calc) (ref 1.9–3.7)
Glucose, Bld: 93 mg/dL (ref 65–99)
Potassium: 3.9 mmol/L (ref 3.5–5.3)
Sodium: 142 mmol/L (ref 135–146)
Total Bilirubin: 0.4 mg/dL (ref 0.2–1.2)
Total Protein: 6.4 g/dL (ref 6.1–8.1)

## 2019-06-14 LAB — CBC WITH DIFFERENTIAL/PLATELET
Absolute Monocytes: 593 cells/uL (ref 200–950)
Basophils Absolute: 71 cells/uL (ref 0–200)
Basophils Relative: 0.9 %
Eosinophils Absolute: 119 cells/uL (ref 15–500)
Eosinophils Relative: 1.5 %
HCT: 41.4 % (ref 35.0–45.0)
Hemoglobin: 13.1 g/dL (ref 11.7–15.5)
Lymphs Abs: 1604 cells/uL (ref 850–3900)
MCH: 24.2 pg — ABNORMAL LOW (ref 27.0–33.0)
MCHC: 31.6 g/dL — ABNORMAL LOW (ref 32.0–36.0)
MCV: 76.4 fL — ABNORMAL LOW (ref 80.0–100.0)
MPV: 9.4 fL (ref 7.5–12.5)
Monocytes Relative: 7.5 %
Neutro Abs: 5514 cells/uL (ref 1500–7800)
Neutrophils Relative %: 69.8 %
Platelets: 337 10*3/uL (ref 140–400)
RBC: 5.42 10*6/uL — ABNORMAL HIGH (ref 3.80–5.10)
RDW: 16.1 % — ABNORMAL HIGH (ref 11.0–15.0)
Total Lymphocyte: 20.3 %
WBC: 7.9 10*3/uL (ref 3.8–10.8)

## 2019-06-14 LAB — CYCLIC CITRUL PEPTIDE ANTIBODY, IGG: Cyclic Citrullin Peptide Ab: <16 UNITS

## 2019-06-14 LAB — SEDIMENTATION RATE: Sed Rate: 28 mm/h (ref 0–30)

## 2019-06-14 LAB — RHEUMATOID FACTOR: Rheumatoid fact SerPl-aCnc: <14 IU/mL (ref <14)

## 2019-06-14 LAB — 14-3-3 ETA PROTEIN: 14-3-3 eta Protein: <0.2 ng/mL (ref <0.2)

## 2019-06-17 NOTE — Progress Notes (Signed)
14-3-3 eta negative. CCP negative.

## 2019-06-28 MED FILL — VALSARTAN 160 MG TABLET: 160 | 30 days supply | Qty: 30 | Fill #2

## 2019-07-17 MED FILL — AMLODIPINE BESYLATE 5 MG TA: 5 | 90 days supply | Qty: 180 | Fill #1

## 2019-07-19 MED FILL — DESVENLAFAXINE SUC ER 100 M: 100 | 90 days supply | Qty: 90 | Fill #1

## 2019-07-22 DIAGNOSIS — E538 Deficiency of other specified B group vitamins: Secondary | ICD-10-CM | POA: Diagnosis not present

## 2019-07-22 DIAGNOSIS — R7303 Prediabetes: Secondary | ICD-10-CM | POA: Diagnosis not present

## 2019-07-22 DIAGNOSIS — D508 Other iron deficiency anemias: Secondary | ICD-10-CM | POA: Diagnosis not present

## 2019-07-22 DIAGNOSIS — I10 Essential (primary) hypertension: Secondary | ICD-10-CM | POA: Diagnosis not present

## 2019-07-22 DIAGNOSIS — R5382 Chronic fatigue, unspecified: Secondary | ICD-10-CM | POA: Diagnosis not present

## 2019-07-22 MED FILL — DICLOFENAC EPOLAMINE 1.3 %: 1.3 | 15 days supply | Qty: 30 | Fill #0

## 2019-07-22 MED FILL — SSD 1% CREAM: 1 | 30 days supply | Qty: 50 | Fill #0

## 2019-07-23 ENCOUNTER — Other Ambulatory Visit: Payer: Self-pay

## 2019-07-23 ENCOUNTER — Ambulatory Visit (INDEPENDENT_AMBULATORY_CARE_PROVIDER_SITE_OTHER): Payer: 59 | Admitting: Cardiology

## 2019-07-23 ENCOUNTER — Ambulatory Visit: Payer: 59 | Admitting: Cardiology

## 2019-07-23 ENCOUNTER — Encounter: Payer: Self-pay | Admitting: Cardiology

## 2019-07-23 ENCOUNTER — Encounter (INDEPENDENT_AMBULATORY_CARE_PROVIDER_SITE_OTHER): Payer: Self-pay

## 2019-07-23 VITALS — BP 140/78 | HR 95 | Ht 65.5 in | Wt 309.0 lb

## 2019-07-23 DIAGNOSIS — M1990 Unspecified osteoarthritis, unspecified site: Secondary | ICD-10-CM

## 2019-07-23 DIAGNOSIS — Z87898 Personal history of other specified conditions: Secondary | ICD-10-CM

## 2019-07-23 DIAGNOSIS — R079 Chest pain, unspecified: Secondary | ICD-10-CM

## 2019-07-23 DIAGNOSIS — I10 Essential (primary) hypertension: Secondary | ICD-10-CM

## 2019-07-23 DIAGNOSIS — R0602 Shortness of breath: Secondary | ICD-10-CM

## 2019-07-23 HISTORY — DX: Chest pain, unspecified: R07.9

## 2019-07-23 HISTORY — DX: Personal history of other specified conditions: Z87.898

## 2019-07-23 HISTORY — DX: Unspecified osteoarthritis, unspecified site: M19.90

## 2019-07-23 HISTORY — DX: Shortness of breath: R06.02

## 2019-07-23 MED ORDER — NITROGLYCERIN 0.4 MG SL SUBL: 0.4000 mg | SUBLINGUAL_TABLET | SUBLINGUAL | 11 refills | Status: DC | PRN

## 2019-07-23 MED ORDER — METOPROLOL TARTRATE 50 MG PO TABS: ORAL_TABLET | ORAL | 0 refills | Status: DC

## 2019-07-23 MED FILL — METOPROLOL TARTRATE 50 MG T: 50 | 1 days supply | Qty: 2 | Fill #0

## 2019-07-23 MED FILL — NITROGLYCERIN 0.4 MG TAB SL: 0.4 | 25 days supply | Qty: 25 | Fill #0

## 2019-07-23 NOTE — Patient Instructions (Addendum)
Medication Instructions:  Your physician has recommended you make the following change in your medication:  TAKE AS NEEDED FOR CHEST PAIN: Nitroglycerin 0.4 mg sublingual (under your tongue) as needed for chest pain. If experiencing chest pain, stop what you are doing and sit down. Take 1 nitroglycerin and wait 5 minutes. If chest pain continues, take another nitroglycerin and wait 5 minutes. If chest pain does not subside, take 1 more nitroglycerin and dial 911. You make take a total of 3 nitroglycerin in a 15 minute time frame.  *If you need a refill on your cardiac medications before your next appointment, please call your pharmacy*   Lab Work: Your physician recommends that you return for lab work 3-7 days before ct: BMP   If you have labs (blood work) drawn today and your tests are completely normal, you will receive your results only by: Marland Kitchen MyChart Message (if you have MyChart) OR . A paper copy in the mail If you have any lab test that is abnormal or we need to change your treatment, we will call you to review the results.   Testing/Procedures: Your physician has requested that you have an echocardiogram. Echocardiography is a painless test that uses sound waves to create images of your heart. It provides your doctor with information about the size and shape of your heart and how well your heart's chambers and valves are working. This procedure takes approximately one hour. There are no restrictions for this procedure.  Your cardiac CT will be scheduled at one of the below locations:   [facility] [address] [place], [place] [address] [phone]  [place] [address] [place], [place] [address] [phone]  If scheduled at [facility], please arrive at the [facility] main entrance of [facility] 30 minutes prior to test start time. Proceed to the [facility] Radiology Department (first  floor) to check-in and test prep.  If scheduled at [facility], please arrive 15 mins early for check-in and test prep.  Please follow these instructions carefully (unless otherwise directed):    On the Night Before the Test: . Be sure to Drink plenty of water. . Do not consume any caffeinated/decaffeinated beverages or chocolate 12 hours prior to your test. . Do not take any antihistamines 12 hours prior to your test. . If you take Metformin do not take 24 hours prior to test.   On the Day of the Test: . Drink plenty of water. Do not drink any water within one hour of the test. . Do not eat any food 4 hours prior to the test. . You may take your regular medications prior to the test.  . Take metoprolol (Lopressor) two hours prior to test. . HOLD Furosemide/Hydrochlorothiazide morning of the test. . FEMALES- please wear underwire-free bra if available         After the Test: . Drink plenty of water. . After receiving IV contrast, you may experience a mild flushed feeling. This is normal. . On occasion, you may experience a mild rash up to 24 hours after the test. This is not dangerous. If this occurs, you can take Benadryl 25 mg and increase your fluid intake. . If you experience trouble breathing, this can be serious. If it is severe call 911 IMMEDIATELY. If it is mild, please call our office. . If you take any of these medications: Glipizide/Metformin, Avandament, Glucavance, please do not take  48 hours after completing test unless otherwise instructed.   Once we have confirmed authorization from your insurance company, we will call you to set up a date and time for your test.   For non-scheduling related questions, please contact the cardiac imaging nurse navigator should you have any questions/concerns: Marchia Bond, RN Navigator Cardiac Imaging [facility] [phone] office  For scheduling needs, including  cancellations and rescheduling, please call [phone].      Follow-Up: At [facility], you and your health needs are our priority.  As part of our continuing mission to provide you with exceptional heart care, we have created designated Provider Care Teams.  These Care Teams include your primary Cardiologist (physician) and Advanced Practice Providers (APPs -  Physician Assistants and Nurse Practitioners) who all work together to provide you with the care you need, when you need it.  We recommend signing up for the patient portal called "MyChart".  Sign up information is provided on this After Visit Summary.  MyChart is used to connect with patients for Virtual Visits (Telemedicine).  Patients are able to view lab/test results, encounter notes, upcoming appointments, etc.  Non-urgent messages can be sent to your provider as well.   To learn more about what you can do with MyChart, go to [email or web address].    Your next appointment:   3 month(s)  The format for your next appointment:   In Person  Provider:   Berniece Salines, DO   Other Instructions   Cardiac CT Angiogram A cardiac CT angiogram is a procedure to look at the heart and the area around the heart. It may be done to help find the cause of chest pains or other symptoms of heart disease. During this procedure, a substance called contrast dye is injected into the blood vessels in the area to be checked. A large X-ray machine, called a CT scanner, then takes detailed pictures of the heart and the surrounding area. The procedure is also sometimes called a coronary CT angiogram, coronary artery scanning, or CTA. A cardiac CT angiogram allows the health care provider to see how well blood is flowing to and from the heart. The health care provider will be able to see if there are any problems, such as:  Blockage or narrowing of the coronary arteries in the heart.  Fluid around the heart.  Signs of weakness or disease in the  muscles, valves, and tissues of the heart. Tell a health care provider about:  Any allergies you have. This is especially important if you have had a previous allergic reaction to contrast dye.  All medicines you are taking, including vitamins, herbs, eye drops, creams, and over-the-counter medicines.  Any blood disorders you have.  Any surgeries you have had.  Any medical conditions you have.  Whether you are pregnant or may be pregnant.  Any anxiety disorders, chronic pain, or other conditions you have that may increase your stress or prevent you from lying still. What are the risks? Generally, this is a safe procedure. However, problems may occur, including:  Bleeding.  Infection.  Allergic reactions to medicines or dyes.  Damage to other structures or organs.  Kidney damage from the contrast dye that is used.  Increased risk of cancer from radiation exposure. This risk is low. Talk with your health care provider about: ? The risks and benefits of testing. ? How you can receive the lowest dose of radiation. What happens before the procedure?  Wear comfortable clothing and  remove any jewelry, glasses, dentures, and hearing aids.  Follow instructions from your health care provider about eating and drinking. This may include: ? For 12 hours before the procedure -- avoid caffeine. This includes tea, coffee, soda, energy drinks, and diet pills. Drink plenty of water or other fluids that do not have caffeine in them. Being well hydrated can prevent complications. ? For 4-6 hours before the procedure -- stop eating and drinking. The contrast dye can cause nausea, but this is less likely if your stomach is empty.  Ask your health care provider about changing or stopping your regular medicines. This is especially important if you are taking diabetes medicines, blood thinners, or medicines to treat problems with erections (erectile dysfunction). What happens during the  procedure?   Hair on your chest may need to be removed so that small sticky patches called electrodes can be placed on your chest. These will transmit information that helps to monitor your heart during the procedure.  An IV will be inserted into one of your veins.  You might be given a medicine to control your heart rate during the procedure. This will help to ensure that good images are obtained.  You will be asked to lie on an exam table. This table will slide in and out of the CT machine during the procedure.  Contrast dye will be injected into the IV. You might feel warm, or you may get a metallic taste in your mouth.  You will be given a medicine called nitroglycerin. This will relax or dilate the arteries in your heart.  The table that you are lying on will move into the CT machine tunnel for the scan.  The person running the machine will give you instructions while the scans are being done. You may be asked to: ? Keep your arms above your head. ? Hold your breath. ? Stay very still, even if the table is moving.  When the scanning is complete, you will be moved out of the machine.  The IV will be removed. The procedure may vary among health care providers and hospitals. What can I expect after the procedure? After your procedure, it is common to have:  A metallic taste in your mouth from the contrast dye.  A feeling of warmth.  A headache from the nitroglycerin. Follow these instructions at home:  Take over-the-counter and prescription medicines only as told by your health care provider.  If you are told, drink enough fluid to keep your urine pale yellow. This will help to flush the contrast dye out of your body.  Most people can return to their normal activities right after the procedure. Ask your health care provider what activities are safe for you.  It is up to you to get the results of your procedure. Ask your health care provider, or the department that is doing  the procedure, when your results will be ready.  Keep all follow-up visits as told by your health care provider. This is important. Contact a health care provider if:  You have any symptoms of allergy to the contrast dye. These include: ? Shortness of breath. ? Rash or hives. ? A racing heartbeat. Summary  A cardiac CT angiogram is a procedure to look at the heart and the area around the heart. It may be done to help find the cause of chest pains or other symptoms of heart disease.  During this procedure, a large X-ray machine, called a CT scanner, takes detailed pictures of  the heart and the surrounding area after a contrast dye has been injected into blood vessels in the area.  Ask your health care provider about changing or stopping your regular medicines before the procedure. This is especially important if you are taking diabetes medicines, blood thinners, or medicines to treat erectile dysfunction.  If you are told, drink enough fluid to keep your urine pale yellow. This will help to flush the contrast dye out of your body. This information is not intended to replace advice given to you by your health care provider. Make sure you discuss any questions you have with your health care provider. Document Revised: 12/19/2018 Document Reviewed: 12/19/2018 Elsevier Patient Education  [place].   Echocardiogram An echocardiogram is a procedure that uses painless sound waves (ultrasound) to produce an image of the heart. Images from an echocardiogram can provide important information about:  Signs of coronary artery disease (CAD).  Aneurysm detection. An aneurysm is a weak or damaged part of an artery wall that bulges out from the normal force of blood pumping through the body.  Heart size and shape. Changes in the size or shape of the heart can be associated with certain conditions, including heart failure, aneurysm, and CAD.  Heart muscle function.  Heart valve  function.  Signs of a past heart attack.  Fluid buildup around the heart.  Thickening of the heart muscle.  A tumor or infectious growth around the heart valves. Tell a health care provider about:  Any allergies you have.  All medicines you are taking, including vitamins, herbs, eye drops, creams, and over-the-counter medicines.  Any blood disorders you have.  Any surgeries you have had.  Any medical conditions you have.  Whether you are pregnant or may be pregnant. What are the risks? Generally, this is a safe procedure. However, problems may occur, including:  Allergic reaction to dye (contrast) that may be used during the procedure. What happens before the procedure? No specific preparation is needed. You may eat and drink normally. What happens during the procedure?   An IV tube may be inserted into one of your veins.  You may receive contrast through this tube. A contrast is an injection that improves the quality of the pictures from your heart.  A gel will be applied to your chest.  A wand-like tool (transducer) will be moved over your chest. The gel will help to transmit the sound waves from the transducer.  The sound waves will harmlessly bounce off of your heart to allow the heart images to be captured in real-time motion. The images will be recorded on a computer. The procedure may vary among health care providers and hospitals. What happens after the procedure?  You may return to your normal, everyday life, including diet, activities, and medicines, unless your health care provider tells you not to do that. Summary  An echocardiogram is a procedure that uses painless sound waves (ultrasound) to produce an image of the heart.  Images from an echocardiogram can provide important information about the size and shape of your heart, heart muscle function, heart valve function, and fluid buildup around your heart.  You do not need to do anything to prepare  before this procedure. You may eat and drink normally.  After the echocardiogram is completed, you may return to your normal, everyday life, unless your health care provider tells you not to do that. This information is not intended to replace advice given to you by  your health care provider. Make sure you discuss any questions you have      with your health care provider.    Nitroglycerin sublingual tablets What is this medicine? NITROGLYCERIN (nye troe GLI ser in) is a type of vasodilator. It relaxes blood vessels, increasing the blood and oxygen supply to your heart. This medicine is used to relieve chest pain caused by angina. It is also used to prevent chest pain before activities like climbing stairs, going outdoors in cold weather, or sexual activity. This medicine may be used for other purposes; ask your health care provider or pharmacist if you have questions. COMMON BRAND NAME(S): Nitroquick, Nitrostat, Nitrotab What should I tell my health care provider before I take this medicine? They need to know if you have any of these conditions:  anemia  head injury, recent stroke, or bleeding in the brain  liver disease  previous heart attack  an unusual or allergic reaction to nitroglycerin, other medicines, foods, dyes, or preservatives  pregnant or trying to get pregnant  breast-feeding How should I use this medicine? Take this medicine by mouth as needed. At the first sign of an angina attack (chest pain or tightness) place one tablet under your tongue. You can also take this medicine 5 to 10 minutes before an event likely to produce chest pain. Follow the directions on the prescription label. Let the tablet dissolve under the tongue. Do not swallow whole. Replace the dose if you accidentally swallow it. It will help if your mouth is not dry. Saliva around the tablet will help it to dissolve more quickly. Do not eat or drink, smoke or chew tobacco while a tablet is  dissolving. If you are not better within 5 minutes after taking ONE dose of nitroglycerin, call 9-1-1 immediately to seek emergency medical care. Do not take more than 3 nitroglycerin tablets over 15 minutes. If you take this medicine often to relieve symptoms of angina, your doctor or health care professional may provide you with different instructions to manage your symptoms. If symptoms do not go away after following these instructions, it is important to call 9-1-1 immediately. Do not take more than 3 nitroglycerin tablets over 15 minutes. Talk to your pediatrician regarding the use of this medicine in children. Special care may be needed. Overdosage: If you think you have taken too much of this medicine contact a poison control center or emergency room at once. NOTE: This medicine is only for you. Do not share this medicine with others. What if I miss a dose? This does not apply. This medicine is only used as needed. What may interact with this medicine? Do not take this medicine with any of the following medications:  certain migraine medicines like ergotamine and dihydroergotamine (DHE)  medicines used to treat erectile dysfunction like sildenafil, tadalafil, and vardenafil  riociguat This medicine may also interact with the following medications:  alteplase  aspirin  heparin  medicines for high blood pressure  medicines for mental depression  other medicines used to treat angina  phenothiazines like chlorpromazine, mesoridazine, prochlorperazine, thioridazine This list may not describe all possible interactions. Give your health care provider a list of all the medicines, herbs, non-prescription drugs, or dietary supplements you use. Also tell them if you smoke, drink alcohol, or use illegal drugs. Some items may interact with your medicine. What should I watch for while using this medicine? Tell your doctor or health care professional if you feel your medicine is no longer  working. Keep  this medicine with you at all times. Sit or lie down when you take your medicine to prevent falling if you feel dizzy or faint after using it. Try to remain calm. This will help you to feel better faster. If you feel dizzy, take several deep breaths and lie down with your feet propped up, or bend forward with your head resting between your knees. You may get drowsy or dizzy. Do not drive, use machinery, or do anything that needs mental alertness until you know how this drug affects you. Do not stand or sit up quickly, especially if you are an older patient. This reduces the risk of dizzy or fainting spells. Alcohol can make you more drowsy and dizzy. Avoid alcoholic drinks. Do not treat yourself for coughs, colds, or pain while you are taking this medicine without asking your doctor or health care professional for advice. Some ingredients may increase your blood pressure. What side effects may I notice from receiving this medicine? Side effects that you should report to your doctor or health care professional as soon as possible:  blurred vision  dry mouth  skin rash  sweating  the feeling of extreme pressure in the head  unusually weak or tired Side effects that usually do not require medical attention (report to your doctor or health care professional if they continue or are bothersome):  flushing of the face or neck  headache  irregular heartbeat, palpitations  nausea, vomiting This list may not describe all possible side effects. Call your doctor for medical advice about side effects. You may report side effects to FDA at 1-800-FDA-1088. Where should I keep my medicine? Keep out of the reach of children. Store at room temperature between 20 and 25 degrees C (68 and 77 degrees F). Store in [facility] [employer]. Protect from light and moisture. Keep tightly closed. Throw away any unused medicine after the expiration date. NOTE: This sheet is a summary. It may not cover  all possible information. If you have questions about this medicine, talk to your doctor, pharmacist, or health care provider.  2020 Elsevier/Gold Standard (2013-02-21 17:57:36)  Document Revised: 08/16/2018 Document Reviewed: 05/28/2016 Elsevier Patient Education  [address] [place] [address].

## 2019-07-23 NOTE — Progress Notes (Signed)
Cardiology Office Note:    Date:  07/23/2019   ID:  Debra Mathews, DOB 1965-08-02, MRN [id number]  PCP:  Maurice Small, MD  Cardiologist:  Berniece Salines, DO  Electrophysiologist:  None   Referring MD: Maurice Small, MD   The patient was referred for chest pain and history of prolonged QT.   History of Present Illness:    Debra Mathews is a 54 y.o. female with a hx of hypertension, seronegative rheumatoid arthritis, anxiety, and history of prolonged qtc.  Patient was referred by rheumatologist because they plan to start her on Plaquenil.  Patient is here she has been experiencing intermittent left-sided/lateral chest pressure.  She notes that when it starts it goes up her shoulder and into her arm.  She tells me that it last for minutes prior to resolution.  Recently she notes that she has episodes where the day before she had the left-sided lateral chest pressure which went up her shoulders into her inner arm.  The next day she experienced numbness into her arm, forearm and fingers.  She has associated shortness of breath but also notes that she has had shortness of breath and she thinks part of it is deconditioning.  No other complaints at this time.  Past Medical History:  Diagnosis Date  . Acne    adult  . Anxiety   . Elevated BP    no dx of HTN  . Fibroids   . HTN (hypertension), benign 10/18/2011  . Iron deficiency anemia 10/18/2011   Hb 9.1 MCV 65.8 08/24/11 hx menorrhagia/fibroids/nl GYN exam  . Menorrhagia with regular cycle 10/18/2011  . Obesity   . Sinus tachycardia     Past Surgical History:  Procedure Laterality Date  . APPENDECTOMY    . MYOMECTOMY    . OVARIAN CYST REMOVAL    . ROOT CANAL  2018    Current Medications: Current Meds  Medication Sig  . ALPRAZolam (XANAX) 0.5 MG tablet Take 0.5 mg by mouth at bedtime as needed for anxiety.  . AMLODIPINE BESYLATE PO Take 10 mg by mouth daily.  Marland Kitchen desvenlafaxine (PRISTIQ) 100 MG 24 hr tablet Take 1 tablet by mouth daily.   . diclofenac (FLECTOR) 1.3 % PTCH APPLY 1 PATCH TO SKIN TWICE A DAY EVERY 12 HOURS AS NEEDED FOR PAIN  . ipratropium (ATROVENT) 0.06 % nasal spray as needed.  Marland Kitchen levocetirizine (XYZAL) 5 MG tablet every evening.  . lisdexamfetamine (VYVANSE) 70 MG capsule Take 1 capsule by mouth in the morning.  . silver sulfADIAZINE (SILVADENE) 1 % cream Apply 1 application topically daily.  . valsartan (DIOVAN) 160 MG tablet Take 160 mg by mouth daily.  Marland Kitchen VYVANSE 70 MG capsule Take 70 mg by mouth every morning.  . [DISCONTINUED] amLODipine (NORVASC) 5 MG tablet Take 10 mg by mouth daily.   . [DISCONTINUED] desvenlafaxine (PRISTIQ) 100 MG 24 hr tablet Take 100 mg by mouth daily. Will start next week 09/24/18     Allergies:   Oseltamivir   Social History   Socioeconomic History  . Marital status: Single    Spouse name: Not on file  . Number of children: Not on file  . Years of education: Not on file  . Highest education level: Not on file  Occupational History  . Not on file  Tobacco Use  . Smoking status: Never Smoker  . Smokeless tobacco: Never Used  Substance and Sexual Activity  . Alcohol use: No  . Drug use: No  . Sexual  activity: Not on file  Other Topics Concern  . Not on file  Social History Narrative  . Not on file   Social Determinants of Health   Financial Resource Strain:   . Difficulty of Paying Living Expenses:   Food Insecurity:   . Worried About Charity fundraiser in the Last Year:   . Arboriculturist in the Last Year:   Transportation Needs:   . Film/video editor (Medical):   Marland Kitchen Lack of Transportation (Non-Medical):   Physical Activity:   . Days of Exercise per Week:   . Minutes of Exercise per Session:   Stress:   . Feeling of Stress :   Social Connections:   . Frequency of Communication with Friends and Family:   . Frequency of Social Gatherings with Friends and Family:   . Attends Religious Services:   . Active Member of Clubs or Organizations:   .  Attends Archivist Meetings:   Marland Kitchen Marital Status:      Family History: The patient's family history includes COPD in her mother; Cancer in her mother; Cancer - Colon in her mother; Cancer - Lung in her mother; Diabetes in her brother, father, and mother; Hypertension in her brother, brother, and mother; Osteoarthritis in her father and mother; Raynaud syndrome in her daughter; Stroke in her brother.  ROS:   Review of Systems  Constitution: Negative for decreased appetite, fever and weight gain.  HENT: Negative for congestion, ear discharge, hoarse voice and sore throat.   Eyes: Negative for discharge, redness, vision loss in right eye and visual halos.  Cardiovascular: Reports chest pain, dyspnea on exertion.  Negative for leg swelling, orthopnea and palpitations.  Respiratory: Negative for cough, hemoptysis, shortness of breath and snoring.   Endocrine: Negative for heat intolerance and polyphagia.  Hematologic/Lymphatic: Negative for bleeding problem. Does not bruise/bleed easily.  Skin: Negative for flushing, nail changes, rash and suspicious lesions.  Musculoskeletal: Negative for arthritis, joint pain, muscle cramps, myalgias, neck pain and stiffness.  Gastrointestinal: Negative for abdominal pain, bowel incontinence, diarrhea and excessive appetite.  Genitourinary: Negative for decreased libido, genital sores and incomplete emptying.  Neurological: Negative for brief paralysis, focal weakness, headaches and loss of balance.  Psychiatric/Behavioral: Negative for altered mental status, depression and suicidal ideas.  Allergic/Immunologic: Negative for HIV exposure and persistent infections.    EKGs/Labs/Other Studies Reviewed:    The following studies were reviewed today:   EKG:  The ekg ordered today demonstrates sinus rhythm, HR 96 bpm with nonspecific ST changes, P wave morphology suggesting right atrial enlargement.  No prior EKG for comparison.  Recent Labs:  06/11/2019: ALT 14; BUN 9; Creat 1.05; Hemoglobin 13.1; Platelets 337; Potassium 3.9; Sodium 142  Recent Lipid Panel No results found for: CHOL, TRIG, HDL, CHOLHDL, VLDL, LDLCALC, LDLDIRECT  Physical Exam:    VS:  BP 140/78 (BP Location: Left Arm, Patient Position: Sitting, Cuff Size: Large)   Pulse 95   Ht 5' 5.5" (1.664 m)   Wt (!) 309 lb (140.2 kg)   SpO2 98%   BMI 50.64 kg/m     Wt Readings from Last 3 Encounters:  07/23/19 (!) 309 lb (140.2 kg)  06/11/19 (!) 311 lb (141.1 kg)  09/21/18 298 lb 9.6 oz (135.4 kg)     GEN: obese, Well nourished, well developed in no acute distress HEENT: Normal NECK: No JVD; No carotid bruits LYMPHATICS: No lymphadenopathy CARDIAC: S1S2 noted,RRR, no murmurs, rubs, gallops RESPIRATORY:  Clear to auscultation  without rales, wheezing or rhonchi  ABDOMEN: Soft, non-tender, non-distended, +bowel sounds, no guarding. EXTREMITIES: No edema, No cyanosis, no clubbing MUSCULOSKELETAL:  No deformity  SKIN: Warm and dry NEUROLOGIC:  Alert and oriented x 3, non-focal PSYCHIATRIC:  Normal affect, good insight  ASSESSMENT:    1. Chest pain, unspecified type   2. HTN (hypertension), benign   3. Arthritis   4. Shortness of breath   5. History of prolonged Q-T interval on ECG   6. Morbid obesity ([place])    PLAN:     1. In terms of her intermittent chest pain and her risk factors, I will like to pursue an ischemic evaluation. The patient does not have any IV contrast allergy -therefore  I will order a coronary CTA. I have educated the patient about this testing and she is agreeable to proceed with this testing.  Sublingual nitroglycerin prescription was sent, its protocol and 911 protocol explained and the patient vocalized understanding questions were answered to the patient's satisfaction.  2. For her shortness of breath - a TTE has been ordered to evaluate RV/LV function and any structural abnormalities.   3/ hx of prolonged qt - QT interval is within  normal today. She should be able to start her Plaquenil.  I will asked the patient to call when she start this medication so we can repeat her EKG in 2 weeks after she has started her medication.  4. HTN - not at target of less than 130/80 mmHg but improved compared to prior. She continues to improve on her diet. We discuss decreasing salt content in her diet - which will include checking seasoning she uses to cook.  5. Morbid obesity - the patient ontinue to work on her weight loss with improving her diet and exercise.   6 she did get blood work with her pcp this week - she will request for me to get records of these labs.   The patient is in agreement with the above plan. The patient left the office in stable condition.  The patient will follow up in 3 months or sooner if needed.   Medication Adjustments/Labs and Tests Ordered: Current medicines are reviewed at length with the patient today.  Concerns regarding medicines are outlined above.  Orders Placed This Encounter  Procedures  . CT CORONARY MORPH W/CTA COR W/SCORE W/CA W/CM &/OR WO/CM  . CT CORONARY FRACTIONAL FLOW RESERVE DATA PREP  . CT CORONARY FRACTIONAL FLOW RESERVE FLUID ANALYSIS  . Basic metabolic panel  . EKG 12-Lead  . ECHOCARDIOGRAM COMPLETE   Meds ordered this encounter  Medications  . nitroGLYCERIN (NITROSTAT) 0.4 MG SL tablet    Sig: Place 1 tablet (0.4 mg total) under the tongue every 5 (five) minutes as needed for chest pain.    Dispense:  25 tablet    Refill:  11  . metoprolol tartrate (LOPRESSOR) 50 MG tablet    Sig: Take 100 mg (two tablets) two hours before ct if heart rate greater than 55.    Dispense:  2 tablet    Refill:  0    Patient Instructions  Medication Instructions:  Your physician has recommended you make the following change in your medication:  TAKE AS NEEDED FOR CHEST PAIN: Nitroglycerin 0.4 mg sublingual (under your tongue) as needed for chest pain. If experiencing chest pain, stop what  you are doing and sit down. Take 1 nitroglycerin and wait 5 minutes. If chest pain continues, take another nitroglycerin and wait 5 minutes. If  chest pain does not subside, take 1 more nitroglycerin and dial 911. You make take a total of 3 nitroglycerin in a 15 minute time frame.  *If you need a refill on your cardiac medications before your next appointment, please call your pharmacy*   Lab Work: Your physician recommends that you return for lab work 3-7 days before ct: BMP   If you have labs (blood work) drawn today and your tests are completely normal, you will receive your results only by: Marland Kitchen MyChart Message (if you have MyChart) OR . A paper copy in the mail If you have any lab test that is abnormal or we need to change your treatment, we will call you to review the results.   Testing/Procedures: Your physician has requested that you have an echocardiogram. Echocardiography is a painless test that uses sound waves to create images of your heart. It provides your doctor with information about the size and shape of your heart and how well your heart's chambers and valves are working. This procedure takes approximately one hour. There are no restrictions for this procedure.  Your cardiac CT will be scheduled at one of the below locations:   [facility] [address] [place], [place] [address] [phone]  [place] [address] [place], [place] [address] [phone]  If scheduled at [facility], please arrive at the [facility] main entrance of [facility] 30 minutes prior to test start time. Proceed to the [facility] Radiology Department (first floor) to check-in and test prep.  If scheduled at [facility], please arrive 15 mins early for check-in and test prep.  Please follow these instructions carefully (unless otherwise directed):    On the Night Before the  Test: . Be sure to Drink plenty of water. . Do not consume any caffeinated/decaffeinated beverages or chocolate 12 hours prior to your test. . Do not take any antihistamines 12 hours prior to your test. . If you take Metformin do not take 24 hours prior to test.   On the Day of the Test: . Drink plenty of water. Do not drink any water within one hour of the test. . Do not eat any food 4 hours prior to the test. . You may take your regular medications prior to the test.  . Take metoprolol (Lopressor) two hours prior to test. . HOLD Furosemide/Hydrochlorothiazide morning of the test. . FEMALES- please wear underwire-free bra if available         After the Test: . Drink plenty of water. . After receiving IV contrast, you may experience a mild flushed feeling. This is normal. . On occasion, you may experience a mild rash up to 24 hours after the test. This is not dangerous. If this occurs, you can take Benadryl 25 mg and increase your fluid intake. . If you experience trouble breathing, this can be serious. If it is severe call 911 IMMEDIATELY. If it is mild, please call our office. . If you take any of these medications: Glipizide/Metformin, Avandament, Glucavance, please do not take 48 hours after completing test unless otherwise instructed.   Once we have confirmed authorization from your insurance company, we will call you to set up a date and time for your test.   For non-scheduling related questions, please contact the cardiac imaging nurse navigator should you have any questions/concerns: Marchia Bond, RN Navigator Cardiac Imaging [facility] [phone] office  For scheduling needs, including cancellations and rescheduling, please call [phone].      Follow-Up: At [facility], you and your health needs are our priority.  As part of our continuing mission to provide you with exceptional heart care, we have created designated Provider Care  Teams.  These Care Teams include your primary Cardiologist (physician) and Advanced Practice Providers (APPs -  Physician Assistants and Nurse Practitioners) who all work together to provide you with the care you need, when you need it.  We recommend signing up for the patient portal called "MyChart".  Sign up information is provided on this After Visit Summary.  MyChart is used to connect with patients for Virtual Visits (Telemedicine).  Patients are able to view lab/test results, encounter notes, upcoming appointments, etc.  Non-urgent messages can be sent to your provider as well.   To learn more about what you can do with MyChart, go to [email or web address].    Your next appointment:   3 month(s)  The format for your next appointment:   In Person  Provider:   Berniece Salines, DO   Other Instructions   Cardiac CT Angiogram A cardiac CT angiogram is a procedure to look at the heart and the area around the heart. It may be done to help find the cause of chest pains or other symptoms of heart disease. During this procedure, a substance called contrast dye is injected into the blood vessels in the area to be checked. A large X-ray machine, called a CT scanner, then takes detailed pictures of the heart and the surrounding area. The procedure is also sometimes called a coronary CT angiogram, coronary artery scanning, or CTA. A cardiac CT angiogram allows the health care provider to see how well blood is flowing to and from the heart. The health care provider will be able to see if there are any problems, such as:  Blockage or narrowing of the coronary arteries in the heart.  Fluid around the heart.  Signs of weakness or disease in the muscles, valves, and tissues of the heart. Tell a health care provider about:  Any allergies you have. This is especially important if you have had a previous allergic reaction to contrast dye.  All medicines you are taking, including vitamins, herbs, eye  drops, creams, and over-the-counter medicines.  Any blood disorders you have.  Any surgeries you have had.  Any medical conditions you have.  Whether you are pregnant or may be pregnant.  Any anxiety disorders, chronic pain, or other conditions you have that may increase your stress or prevent you from lying still. What are the risks? Generally, this is a safe procedure. However, problems may occur, including:  Bleeding.  Infection.  Allergic reactions to medicines or dyes.  Damage to other structures or organs.  Kidney damage from the contrast dye that is used.  Increased risk of cancer from radiation exposure. This risk is low. Talk with your health care provider about: ? The risks and benefits of testing. ? How you can receive the lowest dose of radiation. What happens before the procedure?  Wear comfortable clothing and remove any jewelry, glasses, dentures, and hearing aids.  Follow instructions from your health care provider about eating and drinking. This may include: ? For 12 hours before the procedure - avoid caffeine. This includes tea, coffee, soda, energy drinks, and diet pills. Drink plenty of water or other fluids that do not have caffeine in them. Being well hydrated can prevent complications. ? For 4-6  hours before the procedure - stop eating and drinking. The contrast dye can cause nausea, but this is less likely if your stomach is empty.  Ask your health care provider about changing or stopping your regular medicines. This is especially important if you are taking diabetes medicines, blood thinners, or medicines to treat problems with erections (erectile dysfunction). What happens during the procedure?   Hair on your chest may need to be removed so that small sticky patches called electrodes can be placed on your chest. These will transmit information that helps to monitor your heart during the procedure.  An IV will be inserted into one of your veins.   You might be given a medicine to control your heart rate during the procedure. This will help to ensure that good images are obtained.  You will be asked to lie on an exam table. This table will slide in and out of the CT machine during the procedure.  Contrast dye will be injected into the IV. You might feel warm, or you may get a metallic taste in your mouth.  You will be given a medicine called nitroglycerin. This will relax or dilate the arteries in your heart.  The table that you are lying on will move into the CT machine tunnel for the scan.  The person running the machine will give you instructions while the scans are being done. You may be asked to: ? Keep your arms above your head. ? Hold your breath. ? Stay very still, even if the table is moving.  When the scanning is complete, you will be moved out of the machine.  The IV will be removed. The procedure may vary among health care providers and hospitals. What can I expect after the procedure? After your procedure, it is common to have:  A metallic taste in your mouth from the contrast dye.  A feeling of warmth.  A headache from the nitroglycerin. Follow these instructions at home:  Take over-the-counter and prescription medicines only as told by your health care provider.  If you are told, drink enough fluid to keep your urine pale yellow. This will help to flush the contrast dye out of your body.  Most people can return to their normal activities right after the procedure. Ask your health care provider what activities are safe for you.  It is up to you to get the results of your procedure. Ask your health care provider, or the department that is doing the procedure, when your results will be ready.  Keep all follow-up visits as told by your health care provider. This is important. Contact a health care provider if:  You have any symptoms of allergy to the contrast dye. These include: ? Shortness of breath. ? Rash  or hives. ? A racing heartbeat. Summary  A cardiac CT angiogram is a procedure to look at the heart and the area around the heart. It may be done to help find the cause of chest pains or other symptoms of heart disease.  During this procedure, a large X-ray machine, called a CT scanner, takes detailed pictures of the heart and the surrounding area after a contrast dye has been injected into blood vessels in the area.  Ask your health care provider about changing or stopping your regular medicines before the procedure. This is especially important if you are taking diabetes medicines, blood thinners, or medicines to treat erectile dysfunction.  If you are told, drink enough fluid to keep your urine pale  yellow. This will help to flush the contrast dye out of your body. This information is not intended to replace advice given to you by your health care provider. Make sure you discuss any questions you have with your health care provider. Document Revised: 12/19/2018 Document Reviewed: 12/19/2018 Elsevier Patient Education  [place].   Echocardiogram An echocardiogram is a procedure that uses painless sound waves (ultrasound) to produce an image of the heart. Images from an echocardiogram can provide important information about:  Signs of coronary artery disease (CAD).  Aneurysm detection. An aneurysm is a weak or damaged part of an artery wall that bulges out from the normal force of blood pumping through the body.  Heart size and shape. Changes in the size or shape of the heart can be associated with certain conditions, including heart failure, aneurysm, and CAD.  Heart muscle function.  Heart valve function.  Signs of a past heart attack.  Fluid buildup around the heart.  Thickening of the heart muscle.  A tumor or infectious growth around the heart valves. Tell a health care provider about:  Any allergies you have.  All medicines you are taking, including vitamins,  herbs, eye drops, creams, and over-the-counter medicines.  Any blood disorders you have.  Any surgeries you have had.  Any medical conditions you have.  Whether you are pregnant or may be pregnant. What are the risks? Generally, this is a safe procedure. However, problems may occur, including:  Allergic reaction to dye (contrast) that may be used during the procedure. What happens before the procedure? No specific preparation is needed. You may eat and drink normally. What happens during the procedure?   An IV tube may be inserted into one of your veins.  You may receive contrast through this tube. A contrast is an injection that improves the quality of the pictures from your heart.  A gel will be applied to your chest.  A wand-like tool (transducer) will be moved over your chest. The gel will help to transmit the sound waves from the transducer.  The sound waves will harmlessly bounce off of your heart to allow the heart images to be captured in real-time motion. The images will be recorded on a computer. The procedure may vary among health care providers and hospitals. What happens after the procedure?  You may return to your normal, everyday life, including diet, activities, and medicines, unless your health care provider tells you not to do that. Summary  An echocardiogram is a procedure that uses painless sound waves (ultrasound) to produce an image of the heart.  Images from an echocardiogram can provide important information about the size and shape of your heart, heart muscle function, heart valve function, and fluid buildup around your heart.  You do not need to do anything to prepare before this procedure. You may eat and drink normally.  After the echocardiogram is completed, you may return to your normal, everyday life, unless your health care provider tells you not to do that. This information is not intended to replace advice given to you by       your  health care provider. Make sure you discuss any questions you have      with your health care provider.    Nitroglycerin sublingual tablets What is this medicine? NITROGLYCERIN (nye troe GLI ser in) is a type of vasodilator. It relaxes blood vessels, increasing the blood and oxygen supply to your heart. This medicine is used to relieve chest pain  caused by angina. It is also used to prevent chest pain before activities like climbing stairs, going outdoors in cold weather, or sexual activity. This medicine may be used for other purposes; ask your health care provider or pharmacist if you have questions. COMMON BRAND NAME(S): Nitroquick, Nitrostat, Nitrotab What should I tell my health care provider before I take this medicine? They need to know if you have any of these conditions:  anemia  head injury, recent stroke, or bleeding in the brain  liver disease  previous heart attack  an unusual or allergic reaction to nitroglycerin, other medicines, foods, dyes, or preservatives  pregnant or trying to get pregnant  breast-feeding How should I use this medicine? Take this medicine by mouth as needed. At the first sign of an angina attack (chest pain or tightness) place one tablet under your tongue. You can also take this medicine 5 to 10 minutes before an event likely to produce chest pain. Follow the directions on the prescription label. Let the tablet dissolve under the tongue. Do not swallow whole. Replace the dose if you accidentally swallow it. It will help if your mouth is not dry. Saliva around the tablet will help it to dissolve more quickly. Do not eat or drink, smoke or chew tobacco while a tablet is dissolving. If you are not better within 5 minutes after taking ONE dose of nitroglycerin, call 9-1-1 immediately to seek emergency medical care. Do not take more than 3 nitroglycerin tablets over 15 minutes. If you take this medicine often to relieve symptoms of angina, your doctor  or health care professional may provide you with different instructions to manage your symptoms. If symptoms do not go away after following these instructions, it is important to call 9-1-1 immediately. Do not take more than 3 nitroglycerin tablets over 15 minutes. Talk to your pediatrician regarding the use of this medicine in children. Special care may be needed. Overdosage: If you think you have taken too much of this medicine contact a poison control center or emergency room at once. NOTE: This medicine is only for you. Do not share this medicine with others. What if I miss a dose? This does not apply. This medicine is only used as needed. What may interact with this medicine? Do not take this medicine with any of the following medications:  certain migraine medicines like ergotamine and dihydroergotamine (DHE)  medicines used to treat erectile dysfunction like sildenafil, tadalafil, and vardenafil  riociguat This medicine may also interact with the following medications:  alteplase  aspirin  heparin  medicines for high blood pressure  medicines for mental depression  other medicines used to treat angina  phenothiazines like chlorpromazine, mesoridazine, prochlorperazine, thioridazine This list may not describe all possible interactions. Give your health care provider a list of all the medicines, herbs, non-prescription drugs, or dietary supplements you use. Also tell them if you smoke, drink alcohol, or use illegal drugs. Some items may interact with your medicine. What should I watch for while using this medicine? Tell your doctor or health care professional if you feel your medicine is no longer working. Keep this medicine with you at all times. Sit or lie down when you take your medicine to prevent falling if you feel dizzy or faint after using it. Try to remain calm. This will help you to feel better faster. If you feel dizzy, take several deep breaths and lie down with your  feet propped up, or bend forward with your head resting between  your knees. You may get drowsy or dizzy. Do not drive, use machinery, or do anything that needs mental alertness until you know how this drug affects you. Do not stand or sit up quickly, especially if you are an older patient. This reduces the risk of dizzy or fainting spells. Alcohol can make you more drowsy and dizzy. Avoid alcoholic drinks. Do not treat yourself for coughs, colds, or pain while you are taking this medicine without asking your doctor or health care professional for advice. Some ingredients may increase your blood pressure. What side effects may I notice from receiving this medicine? Side effects that you should report to your doctor or health care professional as soon as possible:  blurred vision  dry mouth  skin rash  sweating  the feeling of extreme pressure in the head  unusually weak or tired Side effects that usually do not require medical attention (report to your doctor or health care professional if they continue or are bothersome):  flushing of the face or neck  headache  irregular heartbeat, palpitations  nausea, vomiting This list may not describe all possible side effects. Call your doctor for medical advice about side effects. You may report side effects to FDA at 1-800-FDA-1088. Where should I keep my medicine? Keep out of the reach of children. Store at room temperature between 20 and 25 degrees C (68 and 77 degrees F). Store in [facility] [employer]. Protect from light and moisture. Keep tightly closed. Throw away any unused medicine after the expiration date. NOTE: This sheet is a summary. It may not cover all possible information. If you have questions about this medicine, talk to your doctor, pharmacist, or health care provider.  2020 Elsevier/Gold Standard (2013-02-21 17:57:36)  Document Revised: 08/16/2018 Document Reviewed: 05/28/2016 Elsevier Patient Education  [place].      Adopting a Healthy Lifestyle.  Know what a healthy weight is for you (roughly BMI <25) and aim to maintain this   Aim for 7+ servings of fruits and vegetables daily   65-80+ fluid ounces of water or unsweet tea for healthy kidneys   Limit to max 1 drink of alcohol per day; avoid smoking/tobacco   Limit animal fats in diet for cholesterol and heart health - choose grass fed whenever available   Avoid highly processed foods, and foods high in saturated/trans fats   Aim for low stress - take time to unwind and care for your mental health   Aim for 150 min of moderate intensity exercise weekly for heart health, and weights twice weekly for bone health   Aim for 7-9 hours of sleep daily   When it comes to diets, agreement about the perfect plan isnt easy to find, even among the experts. Experts at the [employer] developed an idea known as the Healthy Eating Plate. Just imagine a plate divided into logical, healthy portions.   The emphasis is on diet quality:   Load up on vegetables and fruits - one-half of your plate: Aim for color and variety, and remember that potatoes dont count.   Go for whole grains - one-quarter of your plate: Whole wheat, barley, wheat berries, quinoa, oats, brown rice, and foods made with them. If you want pasta, go with whole wheat pasta.   Protein power - one-quarter of your plate: Fish, chicken, beans, and nuts are all healthy, versatile protein sources. Limit red meat.   The diet, however, does go beyond the plate, offering a few other suggestions.  Use healthy plant oils, such as olive, canola, soy, corn, sunflower and peanut. Check the labels, and avoid partially hydrogenated oil, which have unhealthy trans fats.   If youre thirsty, drink water. Coffee and tea are good in moderation, but skip sugary drinks and limit milk and dairy products to one or two daily servings.   The type of carbohydrate in the diet is more  important than the amount. Some sources of carbohydrates, such as vegetables, fruits, whole grains, and beans-are healthier than others.   Finally, stay active  Signed, Berniece Salines, DO  07/23/2019 10:17 PM    [facility]

## 2019-07-29 MED FILL — VALSARTAN 160 MG TABLET: 160 | 30 days supply | Qty: 30 | Fill #3

## 2019-08-06 ENCOUNTER — Other Ambulatory Visit (HOSPITAL_COMMUNITY): Payer: 59

## 2019-08-07 MED FILL — LEVOCETIRIZINE 5 MG TABLET: 5 | 90 days supply | Qty: 90 | Fill #3

## 2019-08-08 NOTE — Progress Notes (Deleted)
Office Visit Note  Patient: Debra Mathews             Date of Birth: 1965/10/20           MRN: [id number]             PCP: Maurice Small, MD Referring: Maurice Small, MD Visit Date: 08/13/2019 Occupation: @GUAROCC @  Subjective:  No chief complaint on file.   History of Present Illness: Debra Mathews is a 54 y.o. female ***   Activities of Daily Living:  Patient reports morning stiffness for *** {minute/hour:19697}.   Patient {ACTIONS;DENIES/REPORTS:21021675::"Denies"} nocturnal pain.  Difficulty dressing/grooming: {ACTIONS;DENIES/REPORTS:21021675::"Denies"} Difficulty climbing stairs: {ACTIONS;DENIES/REPORTS:21021675::"Denies"} Difficulty getting out of chair: {ACTIONS;DENIES/REPORTS:21021675::"Denies"} Difficulty using hands for taps, buttons, cutlery, and/or writing: {ACTIONS;DENIES/REPORTS:21021675::"Denies"}  No Rheumatology ROS completed.   PMFS History:  Patient Active Problem List   Diagnosis Date Noted  . Shortness of breath 07/23/2019  . Arthritis 07/23/2019  . History of prolonged Q-T interval on ECG 07/23/2019  . Chest pain 07/23/2019  . Morbid obesity ([place]) 07/23/2019  . Iron deficiency anemia 10/18/2011  . HTN (hypertension), benign 10/18/2011  . Menorrhagia with regular cycle 10/18/2011    Past Medical History:  Diagnosis Date  . Acne    adult  . Anxiety   . Elevated BP    no dx of HTN  . Fibroids   . HTN (hypertension), benign 10/18/2011  . Iron deficiency anemia 10/18/2011   Hb 9.1 MCV 65.8 08/24/11 hx menorrhagia/fibroids/nl GYN exam  . Menorrhagia with regular cycle 10/18/2011  . Obesity   . Sinus tachycardia     Family History  Problem Relation Age of Onset  . Osteoarthritis Mother   . COPD Mother   . Diabetes Mother   . Hypertension Mother   . Cancer - Lung Mother   . Cancer - Colon Mother   . Cancer Mother        breast   . Osteoarthritis Father   . Diabetes Father   . Diabetes Brother   . Hypertension Brother   . Stroke Brother   .  Hypertension Brother   . Raynaud syndrome Daughter    Past Surgical History:  Procedure Laterality Date  . APPENDECTOMY    . MYOMECTOMY    . OVARIAN CYST REMOVAL    . ROOT CANAL  2018   Social History   Social History Narrative  . Not on file    There is no immunization history on file for this patient.   Objective: Vital Signs: There were no vitals taken for this visit.   Physical Exam   Musculoskeletal Exam: ***  CDAI Exam: CDAI Score: -- Patient Global: --; Provider Global: -- Swollen: --; Tender: -- Joint Exam 08/13/2019   No joint exam has been documented for this visit   There is currently no information documented on the homunculus. Go to the Rheumatology activity and complete the homunculus joint exam.  Investigation: No additional findings.  Imaging: No results found.  Recent Labs: Lab Results  Component Value Date   WBC 7.9 06/11/2019   HGB 13.1 06/11/2019   PLT 337 06/11/2019   NA 142 06/11/2019   K 3.9 06/11/2019   CL 106 06/11/2019   CO2 27 06/11/2019   GLUCOSE 93 06/11/2019   BUN 9 06/11/2019   CREATININE 1.05 06/11/2019   BILITOT 0.4 06/11/2019   ALKPHOS 87 10/28/2017   AST 24 06/11/2019   ALT 14 06/11/2019   PROT 6.4 06/11/2019   ALBUMIN 3.6 10/28/2017  CALCIUM 9.8 06/11/2019   GFRAA 70 06/11/2019    Speciality Comments: No specialty comments available.  Procedures:  No procedures performed Allergies: Oseltamivir   Assessment / Plan:     Visit Diagnoses: No diagnosis found.  Orders: No orders of the defined types were placed in this encounter.  No orders of the defined types were placed in this encounter.   Face-to-face time spent with patient was *** minutes. Greater than 50% of time was spent in counseling and coordination of care.  Follow-Up Instructions: No follow-ups on file.   Ofilia Neas, PA-C  Note - This record has been created using Dragon software.  Chart creation errors have been sought, but may not  always  have been located. Such creation errors do not reflect on  the standard of medical care.

## 2019-08-13 ENCOUNTER — Ambulatory Visit: Payer: 59 | Admitting: Rheumatology

## 2019-08-19 ENCOUNTER — Telehealth (HOSPITAL_COMMUNITY): Payer: Self-pay | Admitting: Emergency Medicine

## 2019-08-19 NOTE — Telephone Encounter (Signed)
Reaching out to patient to offer assistance regarding upcoming cardiac imaging study; pt verbalizes understanding of appt date/time, parking situation and where to check in, pre-test NPO status and medications ordered, and verified current allergies; name and call back number provided for further questions should they arise Debra Benninger RN Navigator Cardiac [facility] [phone] office [phone] cell 

## 2019-08-20 ENCOUNTER — Other Ambulatory Visit: Payer: Self-pay

## 2019-08-20 ENCOUNTER — Other Ambulatory Visit (HOSPITAL_COMMUNITY): Payer: Self-pay | Admitting: Emergency Medicine

## 2019-08-20 ENCOUNTER — Encounter: Payer: 59 | Admitting: *Deleted

## 2019-08-20 ENCOUNTER — Ambulatory Visit (HOSPITAL_COMMUNITY)
Admission: RE | Admit: 2019-08-20 | Discharge: 2019-08-20 | Disposition: A | Discharge status: Home / Self Care | Payer: 59 | Source: Ambulatory Visit | Attending: Cardiology | Admitting: Cardiology

## 2019-08-20 ENCOUNTER — Ambulatory Visit (HOSPITAL_BASED_OUTPATIENT_CLINIC_OR_DEPARTMENT_OTHER): Payer: 59

## 2019-08-20 VITALS — Temp 96.0°F

## 2019-08-20 DIAGNOSIS — R0600 Dyspnea, unspecified: Secondary | ICD-10-CM | POA: Insufficient documentation

## 2019-08-20 DIAGNOSIS — R079 Chest pain, unspecified: Secondary | ICD-10-CM | POA: Insufficient documentation

## 2019-08-20 DIAGNOSIS — Z006 Encounter for examination for normal comparison and control in clinical research program: Secondary | ICD-10-CM

## 2019-08-20 MED ORDER — NITROGLYCERIN 0.4 MG SL SUBL
SUBLINGUAL_TABLET | SUBLINGUAL | Status: AC
  Filled 2019-08-20: qty 2

## 2019-08-20 MED ORDER — PERFLUTREN LIPID MICROSPHERE
1.0000 mL | INTRAVENOUS | Status: AC | PRN
  Administered 2019-08-20: 3 mL via INTRAVENOUS

## 2019-08-20 MED ORDER — METOPROLOL TARTRATE 5 MG/5ML IV SOLN
INTRAVENOUS | Status: AC
  Filled 2019-08-20: qty 10

## 2019-08-20 MED ORDER — NITROGLYCERIN 0.4 MG SL SUBL: 0.8000 mg | SUBLINGUAL_TABLET | Freq: Once | SUBLINGUAL | Status: DC

## 2019-08-20 MED ORDER — METOPROLOL TARTRATE 5 MG/5ML IV SOLN
5.0000 mg | INTRAVENOUS | Status: DC | PRN
  Administered 2019-08-20 (×4): 5 mg via INTRAVENOUS

## 2019-08-20 MED ORDER — IOHEXOL 350 MG/ML SOLN
100.0000 mL | Freq: Once | INTRAVENOUS | Status: AC | PRN
  Administered 2019-08-20: 100 mL via INTRAVENOUS

## 2019-08-20 MED ORDER — IVABRADINE HCL 7.5 MG PO TABS: 7.5000 mg | ORAL_TABLET | Freq: Once | ORAL | 0 refills | Status: AC

## 2019-08-20 NOTE — Progress Notes (Signed)
Phone call to Dr Harriet Masson to notify that we have given this pt 20mg  IV metoprolol and that her HR is still elevated(75-80). She requests that we reschedule this pt and prescribe ivabradine prior to testing. I explained this to the patient. She verbalized understanding. VSS, dc IV. Discharged pt to home via ambulation, assisted her in finding parking garage in [facility]. Will notify Marchia Bond, [facility] Nav.

## 2019-08-20 NOTE — Research (Signed)
CADFEM Informed Consent   Subject Name: Debra Mathews  Subject met inclusion and exclusion criteria.  The informed consent form, study requirements and expectations were reviewed with the subject and questions and concerns were addressed prior to the signing of the consent form.  The subject verbalized understanding of the trial requirements.  The subject agreed to participate in the CADFEM trial and signed the informed consent at 1626 on 08/20/2019.  The informed consent was obtained prior to performance of any protocol-specific procedures for the subject.  A copy of the signed informed consent was given to the subject and a copy was placed in the subject's medical record.   Oletta Cohn.

## 2019-08-20 NOTE — Progress Notes (Deleted)
Unable to complete CCTA with metoprolol, verbal order for 7.5mg  ivabradine given by Tobb for next CCTA appt.

## 2019-08-21 MED FILL — CORLANOR 7.5 MG TABLET: 7.5 | 1 days supply | Qty: 1 | Fill #0

## 2019-08-27 MED FILL — VYVANSE 70 MG CAPSULE: 70 | 90 days supply | Qty: 90 | Fill #0

## 2019-08-27 MED FILL — VALSARTAN 160 MG TABLET: 160 | 30 days supply | Qty: 30 | Fill #4

## 2019-09-02 ENCOUNTER — Telehealth (HOSPITAL_COMMUNITY): Payer: Self-pay | Admitting: Emergency Medicine

## 2019-09-02 NOTE — Telephone Encounter (Signed)
Unable to leave VM as VM box has not been set up yet

## 2019-09-03 ENCOUNTER — Ambulatory Visit (HOSPITAL_COMMUNITY)
Admission: RE | Admit: 2019-09-03 | Discharge: 2019-09-03 | Disposition: A | Discharge status: Home / Self Care | Payer: 59 | Source: Ambulatory Visit | Attending: Cardiology | Admitting: Cardiology

## 2019-09-03 ENCOUNTER — Other Ambulatory Visit: Payer: Self-pay

## 2019-09-03 ENCOUNTER — Encounter (HOSPITAL_COMMUNITY): Payer: Self-pay

## 2019-09-03 DIAGNOSIS — Z538 Procedure and treatment not carried out for other reasons: Secondary | ICD-10-CM | POA: Insufficient documentation

## 2019-09-03 DIAGNOSIS — Z79899 Other long term (current) drug therapy: Secondary | ICD-10-CM | POA: Diagnosis not present

## 2019-09-03 DIAGNOSIS — M1990 Unspecified osteoarthritis, unspecified site: Secondary | ICD-10-CM | POA: Diagnosis not present

## 2019-09-03 MED ORDER — DILTIAZEM HCL 25 MG/5ML IV SOLN: 5.0000 mg | INTRAVENOUS | Status: DC | PRN

## 2019-09-03 MED ORDER — DILTIAZEM HCL 25 MG/5ML IV SOLN
INTRAVENOUS | Status: AC
  Administered 2019-09-03: 5 mg via INTRAVENOUS
  Administered 2019-09-03: 5 mg
  Filled 2019-09-03: qty 5

## 2019-09-03 MED FILL — HYDROXYCHLOROQUINE SULFATE: 200 | 30 days supply | Qty: 60 | Fill #0

## 2019-09-03 NOTE — Progress Notes (Signed)
Patient arrived to the department with a heart rate in the 90's. Patient reported she took the ivabradine as prescribed. Called and spoke to Dr. Harriet Masson since patient previously had not responded to medications. Dr. Harriet Masson wanted patient to receive cardizem 5 mg in two doses at max. MD stated if that did not decrease heart rate to optimal level for scan to cancel and patient will be scheduled for a stress test in the future. Gave patient 2 doses per MD order. Patient did not respond to medication as well as hoped and heart rate remained in the 80's.  Patient was discharged. Nothing further needed at this time.

## 2019-09-13 MED FILL — HYDROXYCHLOROQUINE SULFATE: 200 | 30 days supply | Qty: 60 | Fill #0

## 2019-09-26 MED FILL — VALSARTAN 160 MG TABLET: 160 | 30 days supply | Qty: 30 | Fill #5

## 2019-09-30 MED FILL — DICLOFENAC EPOLAMINE 1.3 %: 1.3 | 15 days supply | Qty: 30 | Fill #1

## 2019-10-02 DIAGNOSIS — M5430 Sciatica, unspecified side: Secondary | ICD-10-CM | POA: Diagnosis not present

## 2019-10-02 MED FILL — tiZANidine HCL 4 MG TABS: 4 | 30 days supply | Qty: 30 | Fill #0

## 2019-10-02 MED FILL — traMADol HCL 50 MG TABS: 50 | 10 days supply | Qty: 40 | Fill #0

## 2019-10-02 MED FILL — predniSONE 20 MG TABS: 20 | 9 days supply | Qty: 18 | Fill #0

## 2019-10-02 MED FILL — AMOX-CLAV 875-125 MG TABLET: 875-125 | 10 days supply | Qty: 20 | Fill #0

## 2019-10-11 ENCOUNTER — Ambulatory Visit: Payer: Self-pay

## 2019-10-11 ENCOUNTER — Encounter: Payer: Self-pay | Admitting: Surgical

## 2019-10-11 ENCOUNTER — Ambulatory Visit (INDEPENDENT_AMBULATORY_CARE_PROVIDER_SITE_OTHER): Payer: 59 | Admitting: Surgical

## 2019-10-11 DIAGNOSIS — M79605 Pain in left leg: Secondary | ICD-10-CM

## 2019-10-11 DIAGNOSIS — M25559 Pain in unspecified hip: Secondary | ICD-10-CM | POA: Diagnosis not present

## 2019-10-11 NOTE — Progress Notes (Signed)
Office Visit Note   Patient: Debra Mathews           Date of Birth: [birth year]           MRN: [id number] Visit Date: 10/11/2019 Requested by: Maurice Small, MD [place] [place],  [place] [address] PCP: Maurice Small, MD  Subjective: Chief Complaint  Patient presents with  . Left Leg - Pain    HPI: Debra Mathews is a 54 y.o. female who presents to the office complaining of left hip pain.  She localizes pain to the lateral aspect of her left hip.  She denies any injury.  Pain is been worsening over the last 3 to 4 weeks.  She complains that her hip "gives way at times".  She has been taking a Dosepak as well as Aleve with some relief but relief is fleeting.  She has a history of rheumatoid arthritis for which she takes hydroxychloroquine.  She denies any groin pain but does note some buttock pain.  She had some numbness and tingling that has resolved.  Pain is worst when standing up from a sitting position or lifting her leg up high, as if to get into a high truck or something like that..                ROS:  All systems reviewed are negative as they relate to the chief complaint within the history of present illness.  Patient denies fevers or chills.  Assessment & Plan: Visit Diagnoses:  1. Pain in left leg     Plan: Patient is a 54 year old female presents complaining of left hip pain.  She localizes pain to the lateral aspect of the left hip.  Pain occasionally travels down her thigh but does not extend past the knee.  She does note some subjective limping but no severe limp or Trendelenburg gait is evident on exam today.  She does have moderate tenderness to palpation over the trochanteric bursa that is asymmetric compared with contralateral side.  Pain is worse with resisted abduction.  She has no significant tenderness palpation throughout her lumbar spine or left-sided paraspinal musculature.  No pain with manipulation of the hip joint on exam.  Discussed options  available to patient.  After discussion, patient wishes to proceed with trochanteric bursa injection.  Injection was delivered under ultrasound guidance to the trochanteric bursa.  Patient tolerated procedure well.  Recommended that patient be attention to how her symptoms change, if at all over the next several days.  Follow-up if pain does not improve or worsens.  Follow-Up Instructions: No follow-ups on file.   Orders:  Orders Placed This Encounter  Procedures  . XR HIP UNILAT W OR W/O PELVIS 2-3 VIEWS LEFT  . XR Lumbar Spine 2-3 Views   No orders of the defined types were placed in this encounter.     Procedures: Large Joint Inj: L greater trochanter on 10/11/2019 5:29 PM Indications: pain and diagnostic evaluation Details: 18 G 3.5 in needle, ultrasound-guided lateral approach  Arthrogram: No  Medications: 5 mL lidocaine 1 %; 4 mL bupivacaine 0.25 %; 40 mg methylPREDNISolone acetate 40 MG/ML Outcome: tolerated well, no immediate complications Procedure, treatment alternatives, risks and benefits explained, specific risks discussed. Consent was given by the patient. Immediately prior to procedure a time out was called to verify the correct patient, procedure, equipment, support staff and site/side marked as required. Patient was prepped and draped in the usual sterile fashion.  Clinical Data: No additional findings.  Objective: Vital Signs: There were no vitals taken for this visit.  Physical Exam:  Constitutional: Patient appears well-developed HEENT:  Head: Normocephalic Eyes:EOM are normal Neck: Normal range of motion Cardiovascular: Normal rate Pulmonary/chest: Effort normal Neurologic: Patient is alert Skin: Skin is warm Psychiatric: Patient has normal mood and affect  Ortho Exam:  Tenderness to palpation over the left trochanteric bursa.  This is moderately tender.  No tenderness to palpation over the right trochanteric bursa.  Pain with resisted abduction  of the hip.  No pain with forward flexion of the hip or internal rotation of the hip.  Negative straight leg raise.  No tenderness palpation throughout the axial lumbar spine or paraspinal musculature.  Negative Stinchfield exam.  Specialty Comments:  No specialty comments available.  Imaging: No results found.   PMFS History: Patient Active Problem List   Diagnosis Date Noted  . Shortness of breath 07/23/2019  . Arthritis 07/23/2019  . History of prolonged Q-T interval on ECG 07/23/2019  . Chest pain 07/23/2019  . Morbid obesity ([place]) 07/23/2019  . Iron deficiency anemia 10/18/2011  . HTN (hypertension), benign 10/18/2011  . Menorrhagia with regular cycle 10/18/2011   Past Medical History:  Diagnosis Date  . Acne    adult  . Anxiety   . Elevated BP    no dx of HTN  . Fibroids   . HTN (hypertension), benign 10/18/2011  . Iron deficiency anemia 10/18/2011   Hb 9.1 MCV 65.8 08/24/11 hx menorrhagia/fibroids/nl GYN exam  . Menorrhagia with regular cycle 10/18/2011  . Obesity   . Sinus tachycardia     Family History  Problem Relation Age of Onset  . Osteoarthritis Mother   . COPD Mother   . Diabetes Mother   . Hypertension Mother   . Cancer - Lung Mother   . Cancer - Colon Mother   . Cancer Mother        breast   . Osteoarthritis Father   . Diabetes Father   . Diabetes Brother   . Hypertension Brother   . Stroke Brother   . Hypertension Brother   . Raynaud syndrome Daughter     Past Surgical History:  Procedure Laterality Date  . APPENDECTOMY    . MYOMECTOMY    . OVARIAN CYST REMOVAL    . ROOT CANAL  2018   Social History   Occupational History  . Not on file  Tobacco Use  . Smoking status: Never Smoker  . Smokeless tobacco: Never Used  Substance and Sexual Activity  . Alcohol use: No  . Drug use: No  . Sexual activity: Not on file

## 2019-10-14 ENCOUNTER — Other Ambulatory Visit (HOSPITAL_COMMUNITY): Payer: Self-pay | Admitting: Family Medicine

## 2019-10-14 ENCOUNTER — Other Ambulatory Visit: Payer: Self-pay

## 2019-10-15 ENCOUNTER — Ambulatory Visit: Payer: 59 | Admitting: Cardiology

## 2019-10-17 ENCOUNTER — Telehealth: Payer: Self-pay | Admitting: Surgical

## 2019-10-17 ENCOUNTER — Other Ambulatory Visit: Payer: Self-pay | Admitting: Surgical

## 2019-10-17 DIAGNOSIS — M25552 Pain in left hip: Secondary | ICD-10-CM | POA: Diagnosis not present

## 2019-10-17 MED ORDER — MELOXICAM 15 MG PO TABS: 15.0000 mg | ORAL_TABLET | Freq: Every day | ORAL | 0 refills | Status: AC

## 2019-10-17 NOTE — Telephone Encounter (Signed)
Sorry to hear that.  I know that she was taking a dosepack last time we saw her so I think I'd rather try a stronger NSAID instead due to the amount of steroids she's had recently.  I Rxed Mobic

## 2019-10-17 NOTE — Telephone Encounter (Signed)
Pls advise.  

## 2019-10-17 NOTE — Telephone Encounter (Signed)
Patient came in to make an appointment and wanted to [facility] to know that her left hip pain is worse.  She has an appointment on Wednesday, June 16th.  She is also wanting to know if [name] would send in an RX for prednisone.  Patient uses [facility].  CB#[phone].  Thank you.

## 2019-10-18 DIAGNOSIS — G4733 Obstructive sleep apnea (adult) (pediatric): Secondary | ICD-10-CM | POA: Diagnosis not present

## 2019-10-18 NOTE — Telephone Encounter (Signed)
Spoke with patient. She is already alternating Mobic/Tylenol/Aleve without relief. She will just wait and follow up with Dr Marlou Sa next week. I called pharmacy and cancelled rx.

## 2019-10-21 MED ORDER — LIDOCAINE HCL 1 % IJ SOLN
5.0000 mL | INTRAMUSCULAR | Status: AC | PRN
  Administered 2019-10-11: 5 mL

## 2019-10-21 MED ORDER — METHYLPREDNISOLONE ACETATE 40 MG/ML IJ SUSP
40.0000 mg | INTRAMUSCULAR | Status: AC | PRN
  Administered 2019-10-11: 40 mg via INTRA_ARTICULAR

## 2019-10-21 MED ORDER — BUPIVACAINE HCL 0.25 % IJ SOLN
4.0000 mL | INTRAMUSCULAR | Status: AC | PRN
  Administered 2019-10-11: 4 mL via INTRA_ARTICULAR

## 2019-10-23 ENCOUNTER — Encounter: Payer: Self-pay | Admitting: Orthopedic Surgery

## 2019-10-23 ENCOUNTER — Ambulatory Visit: Payer: 59 | Admitting: Orthopedic Surgery

## 2019-10-23 DIAGNOSIS — M79605 Pain in left leg: Secondary | ICD-10-CM

## 2019-10-23 NOTE — Progress Notes (Signed)
Office Visit Note   Patient: Debra Mathews           Date of Birth: 02-15-1966           MRN: [id number] Visit Date: 10/23/2019 Requested by: Maurice Small, MD [facility] [place],  [place] [id number] PCP: Maurice Small, MD  Subjective: Chief Complaint  Patient presents with  . Left Hip - Pain  . 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  She was seen by [name] last office visit.  Trochanteric injection was performed then which has not helped her symptoms appreciably.  Reports continued pain which she localizes laterally around the trochanteric region as well as posteriorly around the ischial tuberosity.  Denies much in the way of groin pain.  She has some occasional back spasm.  Hard for her to go up and down the stairs very well.  She describes possible weakness.  She does have a history of rheumatoid arthritis and some morning stiffness.  She is on Plaquenil for that.  She describes what may have been the inciting injury when she had to climb high steps to get into a tow truck.  This happened several months ago.  She noticed herself limping after that excursion.  Prednisone helped her initially.  She has actually used her arms to help lift that leg up into and out of the car vehicle when her hip is flexed.  Hard for her to sleep on the left-hand side.  She cannot go up left leg first on stairs.              ROS: All systems reviewed are negative as they relate to the chief complaint within the history of present illness.  Patient denies  fevers or chills.   Assessment & Plan: Visit Diagnoses:  1. Pain in left leg     Plan: Impression is left hip pain long duration with some weakness.  She had trouble doing a single-leg stand on the left-hand side compared to the right.  She may have partial thickness gluteus medius and minimus tears.  Trochanteric bursitis is another differential.  She is not having much in the way of radicular symptoms and  her hip exam today is benign along with radiographs.  She needs hip MRI scan to evaluate her gluteus minimus and medius.  Open MRI.  Prescription for cane given to use in the right hand.  Follow-up after that study.  Follow-Up Instructions: No follow-ups on file.   Orders:  Orders Placed This Encounter  Procedures  . MR Pelvis w/o contrast   No orders of the defined types were placed in this encounter.     Procedures: No procedures performed   Clinical Data: No additional findings.  Objective: Vital Signs: There were no vitals taken for this visit.  Physical Exam:   Constitutional: Patient appears well-developed HEENT:  Head: Normocephalic Eyes:EOM are normal Neck: Normal range of motion Cardiovascular: Normal rate Pulmonary/chest: Effort normal Neurologic: Patient is alert Skin: Skin is warm Psychiatric: Patient has normal mood and affect    Ortho Exam: Ortho exam demonstrates normal gait alignment.  Difficulty with single leg stand on the left compared to the right.  She has 5 out of 5 ankle dorsiflexion plantarflexion quad and hamstring strength.  Pretty good hip flexion strength 5 out of 5 on the left 5+ out of 5 on the right.  5+ out of 5 hip abduction and  adduction strength.  No discrete trochanteric tenderness today left versus right.  Not much in the way of back pain to direct palpation.  Specialty Comments:  No specialty comments available.  Imaging: No results found.   PMFS History: Patient Active Problem List   Diagnosis Date Noted  . Shortness of breath 07/23/2019  . Arthritis 07/23/2019  . History of prolonged Q-T interval on ECG 07/23/2019  . Chest pain 07/23/2019  . 'light-for-dates' infant with signs of fetal malnutrition 11/01/2016  . Obesity, Class III, BMI 40-49.9 (morbid obesity) ([place]) 07/12/2016  . Iron deficiency anemia 10/18/2011  . HTN (hypertension), benign 10/18/2011  . Menorrhagia with regular cycle 10/18/2011   Past Medical  History:  Diagnosis Date  . 'light-for-dates' infant with signs of fetal malnutrition 11/01/2016  . Acne    adult  . Anxiety   . Arthritis 07/23/2019  . Chest pain 07/23/2019  . Elevated BP    no dx of HTN  . Fibroids   . History of prolonged Q-T interval on ECG 07/23/2019  . HTN (hypertension), benign 10/18/2011  . Iron deficiency anemia 10/18/2011   Hb 9.1 MCV 65.8 08/24/11 hx menorrhagia/fibroids/nl GYN exam  . Menorrhagia with regular cycle 10/18/2011  . Obesity   . Obesity, Class III, BMI 40-49.9 (morbid obesity) ([place]) 07/12/2016  . Shortness of breath 07/23/2019  . Sinus tachycardia     Family History  Problem Relation Age of Onset  . Osteoarthritis Mother   . COPD Mother   . Diabetes Mother   . Hypertension Mother   . Cancer - Lung Mother   . Cancer - Colon Mother   . Cancer Mother        breast   . Osteoarthritis Father   . Diabetes Father   . Diabetes Brother   . Hypertension Brother   . Stroke Brother   . Hypertension Brother   . Raynaud syndrome Daughter     Past Surgical History:  Procedure Laterality Date  . APPENDECTOMY    . MYOMECTOMY    . OVARIAN CYST REMOVAL    . ROOT CANAL  2018   Social History   Occupational History  . Not on file  Tobacco Use  . Smoking status: Never Smoker  . Smokeless tobacco: Never Used  Vaping Use  . Vaping Use: Never used  Substance and Sexual Activity  . Alcohol use: No  . Drug use: No  . Sexual activity: Not on file

## 2019-10-25 MED FILL — VALSARTAN 160 MG TABLET: 160 | 30 days supply | Qty: 30 | Fill #0

## 2019-10-25 MED FILL — HYDROXYCHLOROQUINE SULFATE: 200 | 30 days supply | Qty: 60 | Fill #1

## 2019-11-05 ENCOUNTER — Other Ambulatory Visit (HOSPITAL_COMMUNITY): Payer: Self-pay | Admitting: Family Medicine

## 2019-11-05 MED FILL — LEVOCETIRIZINE 5 MG TABLET: 5 | 90 days supply | Qty: 90 | Fill #0

## 2019-11-08 ENCOUNTER — Ambulatory Visit: Payer: 59 | Admitting: Surgical

## 2019-11-13 ENCOUNTER — Ambulatory Visit: Payer: 59 | Admitting: Orthopedic Surgery

## 2019-11-13 DIAGNOSIS — M25559 Pain in unspecified hip: Secondary | ICD-10-CM

## 2019-11-13 MED ORDER — MELOXICAM 15 MG PO TABS: 15.0000 mg | ORAL_TABLET | Freq: Every day | ORAL | 0 refills | Status: DC

## 2019-11-13 MED FILL — MELOXICAM 15 MG TABLET: 15 | 30 days supply | Qty: 30 | Fill #0

## 2019-11-14 MED FILL — traMADol HCL 50 MG TABS: 50 | 10 days supply | Qty: 40 | Fill #1

## 2019-11-14 MED FILL — tiZANidine HCL 4 MG TABS: 4 | 30 days supply | Qty: 30 | Fill #1

## 2019-11-16 ENCOUNTER — Encounter: Payer: Self-pay | Admitting: Orthopedic Surgery

## 2019-11-16 NOTE — Progress Notes (Signed)
Office Visit Note   Patient: Debra Mathews           Date of Birth: [birth year]           MRN: [id number] Visit Date: 11/13/2019 Requested by: Maurice Small, MD [place],  [place] [id number] PCP: Maurice Small, MD  Subjective: Chief Complaint  Patient presents with  . Follow-up    HPI: Debra Mathews is a 54 year old patient with left hip pain.  MRI pelvis scheduled for 11/28/2019.  She has been ambulating with a cane.  She has had 2 near falls.  She does describe feeling a pop in that left lateral trochanteric region.  She took Aleve and Zanaflex for her symptoms.  She does describe some difficulty with lateral movement.  Hard for her to bend over.  She also describes weak hip abduction.  Hard for her to get in and out of the car.  Denies any low back pain.  She has been on hydroxychloroquine 6 weeks for rheumatoid arthritis.              ROS: All systems reviewed are negative as they relate to the chief complaint within the history of present illness.  Patient denies  fevers or chills.   Assessment & Plan: Visit Diagnoses:  1. Greater trochanteric pain syndrome     Plan: Impression is left hip pain with some abductor weakness.  This does not really appear to be radiculopathy.  I think she may have partial or complete tearing of the gluteus minimus and medius.  This could be a difficult situation for Debra Mathews.  Plan to see her back after her MRI scan which should help to elucidate some of the structural problems which could be causing hip pain.  No real groin pain today with internal extra rotation of the leg.  Follow-Up Instructions: Return for after MRI.   Orders:  No orders of the defined types were placed in this encounter.  Meds ordered this encounter  Medications  . meloxicam (MOBIC) 15 MG tablet    Sig: Take 1 tablet (15 mg total) by mouth daily.    Dispense:  30 tablet    Refill:  0      Procedures: No procedures performed   Clinical Data: No  additional findings.  Objective: Vital Signs: There were no vitals taken for this visit.  Physical Exam:   Constitutional: Patient appears well-developed HEENT:  Head: Normocephalic Eyes:EOM are normal Neck: Normal range of motion Cardiovascular: Normal rate Pulmonary/chest: Effort normal Neurologic: Patient is alert Skin: Skin is warm Psychiatric: Patient has normal mood and affect    Ortho Exam: Ortho exam demonstrates antalgic gait to the left but it is not really a Trendelenburg type gait.  She has difficulty standing only on the left leg.  Hip flexion strength is 5+ out of 5 on the right 4 out of 5 on the left.  Abduction strength is also weaker on the left compared to the right.  No groin pain with internal extra rotation of either leg.  Specialty Comments:  No specialty comments available.  Imaging: No results found.   PMFS History: Patient Active Problem List   Diagnosis Date Noted  . Shortness of breath 07/23/2019  . Arthritis 07/23/2019  . History of prolonged Q-T interval on ECG 07/23/2019  . Chest pain 07/23/2019  . 'light-for-dates' infant with signs of fetal malnutrition 11/01/2016  . Obesity, Class III, BMI 40-49.9 (morbid obesity) ([place]) 07/12/2016  . Iron deficiency  anemia 10/18/2011  . HTN (hypertension), benign 10/18/2011  . Menorrhagia with regular cycle 10/18/2011   Past Medical History:  Diagnosis Date  . 'light-for-dates' infant with signs of fetal malnutrition 11/01/2016  . Acne    adult  . Anxiety   . Arthritis 07/23/2019  . Chest pain 07/23/2019  . Elevated BP    no dx of HTN  . Fibroids   . History of prolonged Q-T interval on ECG 07/23/2019  . HTN (hypertension), benign 10/18/2011  . Iron deficiency anemia 10/18/2011   Hb 9.1 MCV 65.8 08/24/11 hx menorrhagia/fibroids/nl GYN exam  . Menorrhagia with regular cycle 10/18/2011  . Obesity   . Obesity, Class III, BMI 40-49.9 (morbid obesity) ([place]) 07/12/2016  . Shortness of breath 07/23/2019  .  Sinus tachycardia     Family History  Problem Relation Age of Onset  . Osteoarthritis Mother   . COPD Mother   . Diabetes Mother   . Hypertension Mother   . Cancer - Lung Mother   . Cancer - Colon Mother   . Cancer Mother        breast   . Osteoarthritis Father   . Diabetes Father   . Diabetes Brother   . Hypertension Brother   . Stroke Brother   . Hypertension Brother   . Raynaud syndrome Daughter     Past Surgical History:  Procedure Laterality Date  . APPENDECTOMY    . MYOMECTOMY    . OVARIAN CYST REMOVAL    . ROOT CANAL  2018   Social History   Occupational History  . Not on file  Tobacco Use  . Smoking status: Never Smoker  . Smokeless tobacco: Never Used  Vaping Use  . Vaping Use: Never used  Substance and Sexual Activity  . Alcohol use: No  . Drug use: No  . Sexual activity: Not on file

## 2019-11-17 ENCOUNTER — Other Ambulatory Visit: Payer: Self-pay

## 2019-11-17 ENCOUNTER — Ambulatory Visit
Admission: RE | Admit: 2019-11-17 | Discharge: 2019-11-17 | Disposition: A | Discharge status: Home / Self Care | Payer: 59 | Source: Ambulatory Visit | Attending: Orthopedic Surgery | Admitting: Orthopedic Surgery

## 2019-11-17 DIAGNOSIS — M79605 Pain in left leg: Secondary | ICD-10-CM

## 2019-11-17 DIAGNOSIS — M160 Bilateral primary osteoarthritis of hip: Secondary | ICD-10-CM | POA: Diagnosis not present

## 2019-11-20 NOTE — Progress Notes (Signed)
Hi Debra Mathews I just talked to Debra Mathews.  Continuing to have a lot of hip pain and buttock pain and difficulty with bending.  MRI scan of the pelvis was essentially normal in the left hip.  Cannot really find anything concrete in terms of arthritis or tendon rupture or tendinitis that would account for the symptoms.  For that reason I recommend that we proceed with MRI scanning of the lumbar spine.  I think that we will localize Debra Mathews pathology.  If you could set that up that would be great.  Thank you

## 2019-11-21 ENCOUNTER — Other Ambulatory Visit: Payer: Self-pay

## 2019-11-21 DIAGNOSIS — M79605 Pain in left leg: Secondary | ICD-10-CM

## 2019-11-25 MED FILL — VYVANSE 70 MG CAPSULE: 70 | 90 days supply | Qty: 90 | Fill #0

## 2019-11-25 MED FILL — VALSARTAN 160 MG TABLET: 160 | 30 days supply | Qty: 30 | Fill #1

## 2019-11-28 ENCOUNTER — Other Ambulatory Visit: Payer: 59

## 2019-12-11 DIAGNOSIS — L03313 Cellulitis of chest wall: Secondary | ICD-10-CM | POA: Diagnosis not present

## 2019-12-11 DIAGNOSIS — H524 Presbyopia: Secondary | ICD-10-CM | POA: Diagnosis not present

## 2019-12-15 ENCOUNTER — Ambulatory Visit
Admission: RE | Admit: 2019-12-15 | Discharge: 2019-12-15 | Disposition: A | Discharge status: Home / Self Care | Payer: 59 | Source: Ambulatory Visit | Attending: Orthopedic Surgery | Admitting: Orthopedic Surgery

## 2019-12-15 ENCOUNTER — Other Ambulatory Visit: Payer: Self-pay

## 2019-12-15 DIAGNOSIS — M5117 Intervertebral disc disorders with radiculopathy, lumbosacral region: Secondary | ICD-10-CM | POA: Diagnosis not present

## 2019-12-15 DIAGNOSIS — M4726 Other spondylosis with radiculopathy, lumbar region: Secondary | ICD-10-CM | POA: Diagnosis not present

## 2019-12-15 DIAGNOSIS — M79605 Pain in left leg: Secondary | ICD-10-CM

## 2019-12-18 ENCOUNTER — Ambulatory Visit: Payer: 59 | Admitting: Orthopedic Surgery

## 2019-12-18 DIAGNOSIS — M79605 Pain in left leg: Secondary | ICD-10-CM | POA: Diagnosis not present

## 2019-12-18 MED ORDER — METHYLPREDNISOLONE 4 MG PO TBPK: ORAL_TABLET | ORAL | 0 refills | Status: DC

## 2019-12-20 ENCOUNTER — Telehealth: Payer: Self-pay | Admitting: Orthopedic Surgery

## 2019-12-20 ENCOUNTER — Encounter: Payer: Self-pay | Admitting: Orthopedic Surgery

## 2019-12-20 NOTE — Progress Notes (Signed)
Office Visit Note   Patient: Debra Mathews           Date of Birth: [birth year]           MRN: [id number] Visit Date: 12/18/2019 Requested by: Maurice Small, MD [place],  [place] [address] PCP: Maurice Small, MD  Subjective: Chief Complaint  Patient presents with  . Follow-up    HPI: Debra Mathews is a 54 year old patient with left leg and hip pain. She is had an MRI scan of her pelvis which really did not show any abnormality of the left hip region. She is had an MRI scan of her back which does show on the left-hand side L3 and L4 foraminal stenosis. She is having continued pain in that left hip region. She had a trochanteric bursa injection which did not help. She has not worked in a while.              ROS: All systems reviewed are negative as they relate to the chief complaint within the history of present illness.  Patient denies  fevers or chills.   Assessment & Plan: Visit Diagnoses:  1. Pain in left leg     Plan: Impression is no definite pathologic findings on pelvic MRI for the left hip. I think this foraminal stenosis on the left-hand side at L3-4 on the MRI scan is the most likely culprit for her hip pain. Would like for her to see Dr. Ernestina Patches for diagnostic and therapeutic injection. In the meantime for her symptoms Medrol Dosepak is prescribed.  Follow-Up Instructions: No follow-ups on file.   Orders:  Orders Placed This Encounter  Procedures  . Ambulatory referral to Physical Medicine Rehab   Meds ordered this encounter  Medications  . methylPREDNISolone (MEDROL DOSEPAK) 4 MG TBPK tablet    Sig: Take as directed    Dispense:  21 tablet    Refill:  0      Procedures: No procedures performed   Clinical Data: No additional findings.  Objective: Vital Signs: There were no vitals taken for this visit.  Physical Exam:   Constitutional: Patient appears well-developed HEENT:  Head: Normocephalic Eyes:EOM are normal Neck: Normal range  of motion Cardiovascular: Normal rate Pulmonary/chest: Effort normal Neurologic: Patient is alert Skin: Skin is warm Psychiatric: Patient has normal mood and affect    Ortho Exam: Ortho exam demonstrates that the patient does sit with her left gluteal region elevated on the left-hand side. This is consistent with the way I have seen other patients with back pain set in the past. Equivocal nerve root tension signs. Mild groin pain with internal X rotation of the leg. No distinct weakness. Ortho exam is otherwise unchanged  Specialty Comments:  No specialty comments available.  Imaging: No results found.   PMFS History: Patient Active Problem List   Diagnosis Date Noted  . Shortness of breath 07/23/2019  . Arthritis 07/23/2019  . History of prolonged Q-T interval on ECG 07/23/2019  . Chest pain 07/23/2019  . 'light-for-dates' infant with signs of fetal malnutrition 11/01/2016  . Obesity, Class III, BMI 40-49.9 (morbid obesity) ([place]) 07/12/2016  . Iron deficiency anemia 10/18/2011  . HTN (hypertension), benign 10/18/2011  . Menorrhagia with regular cycle 10/18/2011   Past Medical History:  Diagnosis Date  . 'light-for-dates' infant with signs of fetal malnutrition 11/01/2016  . Acne    adult  . Anxiety   . Arthritis 07/23/2019  . Chest pain 07/23/2019  . Elevated BP  no dx of HTN  . Fibroids   . History of prolonged Q-T interval on ECG 07/23/2019  . HTN (hypertension), benign 10/18/2011  . Iron deficiency anemia 10/18/2011   Hb 9.1 MCV 65.8 08/24/11 hx menorrhagia/fibroids/nl GYN exam  . Menorrhagia with regular cycle 10/18/2011  . Obesity   . Obesity, Class III, BMI 40-49.9 (morbid obesity) ([place]) 07/12/2016  . Shortness of breath 07/23/2019  . Sinus tachycardia     Family History  Problem Relation Age of Onset  . Osteoarthritis Mother   . COPD Mother   . Diabetes Mother   . Hypertension Mother   . Cancer - Lung Mother   . Cancer - Colon Mother   . Cancer Mother         breast   . Osteoarthritis Father   . Diabetes Father   . Diabetes Brother   . Hypertension Brother   . Stroke Brother   . Hypertension Brother   . Raynaud syndrome Daughter     Past Surgical History:  Procedure Laterality Date  . APPENDECTOMY    . MYOMECTOMY    . OVARIAN CYST REMOVAL    . ROOT CANAL  2018   Social History   Occupational History  . Not on file  Tobacco Use  . Smoking status: Never Smoker  . Smokeless tobacco: Never Used  Vaping Use  . Vaping Use: Never used  Substance and Sexual Activity  . Alcohol use: No  . Drug use: No  . Sexual activity: Not on file

## 2019-12-20 NOTE — Telephone Encounter (Signed)
[employer] forms received. Sent to [employer]. 

## 2019-12-23 MED FILL — tiZANidine HCL 4 MG TABS: 4 | 30 days supply | Qty: 30 | Fill #0

## 2019-12-25 MED FILL — DICLOFENAC EPOLAMINE 1.3 %: 1.3 | 30 days supply | Qty: 30 | Fill #2

## 2019-12-25 MED FILL — HYDROXYCHLOROQUINE SULFATE: 200 | 30 days supply | Qty: 60 | Fill #2

## 2019-12-25 MED FILL — VALSARTAN 160 MG TABLET: 160 | 30 days supply | Qty: 30 | Fill #2

## 2020-01-01 ENCOUNTER — Ambulatory Visit (HOSPITAL_COMMUNITY): Payer: Self-pay

## 2020-01-01 ENCOUNTER — Encounter: Payer: Self-pay | Admitting: Surgical

## 2020-01-01 ENCOUNTER — Ambulatory Visit (INDEPENDENT_AMBULATORY_CARE_PROVIDER_SITE_OTHER): Payer: 59 | Admitting: Surgical

## 2020-01-01 DIAGNOSIS — M5410 Radiculopathy, site unspecified: Secondary | ICD-10-CM | POA: Diagnosis not present

## 2020-01-01 MED ORDER — TRAMADOL HCL 50 MG PO TABS: 50.0000 mg | ORAL_TABLET | Freq: Two times a day (BID) | ORAL | 0 refills | Status: DC | PRN

## 2020-01-01 MED ORDER — GABAPENTIN 300 MG PO CAPS: 300.0000 mg | ORAL_CAPSULE | Freq: Three times a day (TID) | ORAL | 0 refills | Status: DC

## 2020-01-01 MED FILL — traMADol HCL 50 MG TABS: 50 | 8 days supply | Qty: 30 | Fill #0

## 2020-01-01 MED FILL — GABAPENTIN 300 MG CAPSULE: 300 | 10 days supply | Qty: 30 | Fill #0

## 2020-01-01 NOTE — Progress Notes (Signed)
Office Visit Note   Patient: Debra Mathews           Date of Birth: June 29, 1965           MRN: [id number] Visit Date: 01/01/2020 Requested by: Maurice Small, MD [place],  [place] [id number] PCP: Maurice Small, MD  Subjective: Chief Complaint  Patient presents with  . Left Hip - Pain    HPI: Debra Mathews is a 54 y.o. female who presents to the office complaining of left leg pain.  She notes that the radicular pain that she has been having started to significantly worsen on Monday.  She has had difficulty weightbearing with radicular pain traveling down the length of her left leg especially affecting her left posterior calf.  She describes a burning pain in the posterior calf that is worse with ambulation.  This also feels like a cramping pain.  She notes that spine flexion and bending over when she walks significantly improves her pain.  She denies any new red flag symptoms such as bowel/bladder incontinence or saddle anesthesia.  She has no history of vascular claudication.  No history of osteoporosis.  Denies any groin pain currently.  She is scheduled for epidural steroid injection in her lumbar spine with Dr. Ernestina Patches on September 1..                ROS: All systems reviewed are negative as they relate to the chief complaint within the history of present illness.  Patient denies fevers or chills.  Assessment & Plan: Visit Diagnoses:  1. Radicular syndrome of left leg     Plan: Patient is a 54 year old female presents complaining of increased left leg radicular pain.  No injury brought on the increase in pain.  She notes difficulty standing straight.  Pain was significantly worse yesterday and has been a little bit better this morning.  With the worst pain in the left calf that is worse with walking and improved with spine flexion, impression is that this is coming from her lumbar spine pathology.  Plan to continue with plan for epidural steroid injection with Dr.  Ernestina Patches on 01/08/2020.  In the meantime, we will try gabapentin and tramadol as a bridge therapy between now and the time she has her injection.  Patient understands.  She is not on any blood thinners aside from just a baby aspirin.  Follow-up with Dr. Ernestina Patches.  Follow-Up Instructions: No follow-ups on file.   Orders:  No orders of the defined types were placed in this encounter.  Meds ordered this encounter  Medications  . gabapentin (NEURONTIN) 300 MG capsule    Sig: Take 1 capsule (300 mg total) by mouth 3 (three) times daily.    Dispense:  30 capsule    Refill:  0  . traMADol (ULTRAM) 50 MG tablet    Sig: Take 1-2 tablets (50-100 mg total) by mouth 2 (two) times daily as needed.    Dispense:  30 tablet    Refill:  0      Procedures: No procedures performed   Clinical Data: No additional findings.  Objective: Vital Signs: There were no vitals taken for this visit.  Physical Exam:  Constitutional: Patient appears well-developed HEENT:  Head: Normocephalic Eyes:EOM are normal Neck: Normal range of motion Cardiovascular: Normal rate Pulmonary/chest: Effort normal Neurologic: Patient is alert Skin: Skin is warm Psychiatric: Patient has normal mood and affect  Ortho Exam: Ortho exam demonstrates left leg with positive  straight leg raise.  No clonus on exam.  5/5 motor strength of the bilateral quadricep, hamstring, dorsiflexion, lower flexion.  Excellent strength of the right hip flexor with mild weakness of the left hip flexor on exam.  Sensation diminished throughout the entirety of the left lower leg.  No significant tenderness to palpation throughout the posterior calf.  Tenderness throughout the axial lumbar spine and left-sided paraspinal musculature.  No significant tenderness over the trochanteric bursa.  No pain with hip range of motion.  Specialty Comments:  No specialty comments available.  Imaging: No results found.   PMFS History: Patient Active Problem List    Diagnosis Date Noted  . Shortness of breath 07/23/2019  . Arthritis 07/23/2019  . History of prolonged Q-T interval on ECG 07/23/2019  . Chest pain 07/23/2019  . 'light-for-dates' infant with signs of fetal malnutrition 11/01/2016  . Obesity, Class III, BMI 40-49.9 (morbid obesity) ([place] [place]) 07/12/2016  . Iron deficiency anemia 10/18/2011  . HTN (hypertension), benign 10/18/2011  . Menorrhagia with regular cycle 10/18/2011   Past Medical History:  Diagnosis Date  . 'light-for-dates' infant with signs of fetal malnutrition 11/01/2016  . Acne    adult  . Anxiety   . Arthritis 07/23/2019  . Chest pain 07/23/2019  . Elevated BP    no dx of HTN  . Fibroids   . History of prolonged Q-T interval on ECG 07/23/2019  . HTN (hypertension), benign 10/18/2011  . Iron deficiency anemia 10/18/2011   Hb 9.1 MCV 65.8 08/24/11 hx menorrhagia/fibroids/nl GYN exam  . Menorrhagia with regular cycle 10/18/2011  . Obesity   . Obesity, Class III, BMI 40-49.9 (morbid obesity) ([facility]) 07/12/2016  . Shortness of breath 07/23/2019  . Sinus tachycardia     Family History  Problem Relation Age of Onset  . Osteoarthritis Mother   . COPD Mother   . Diabetes Mother   . Hypertension Mother   . Cancer - Lung Mother   . Cancer - Colon Mother   . Cancer Mother        breast   . Osteoarthritis Father   . Diabetes Father   . Diabetes Brother   . Hypertension Brother   . Stroke Brother   . Hypertension Brother   . Raynaud syndrome Daughter     Past Surgical History:  Procedure Laterality Date  . APPENDECTOMY    . MYOMECTOMY    . OVARIAN CYST REMOVAL    . ROOT CANAL  2018   Social History   Occupational History  . Not on file  Tobacco Use  . Smoking status: Never Smoker  . Smokeless tobacco: Never Used  Vaping Use  . Vaping Use: Never used  Substance and Sexual Activity  . Alcohol use: No  . Drug use: No  . Sexual activity: Not on file

## 2020-01-06 ENCOUNTER — Telehealth: Payer: Self-pay | Admitting: Physical Medicine and Rehabilitation

## 2020-01-06 NOTE — Telephone Encounter (Signed)
Patient called.   She is requesting a call back to r/s her upcoming appointment.   Call back: [phone]

## 2020-01-07 DIAGNOSIS — F419 Anxiety disorder, unspecified: Secondary | ICD-10-CM | POA: Diagnosis not present

## 2020-01-07 NOTE — Telephone Encounter (Signed)
Patient will call back when she finds out if she will be able to have a driver on 3/09 at [phone] or 1500.

## 2020-01-08 ENCOUNTER — Ambulatory Visit: Payer: 59 | Admitting: Physical Medicine and Rehabilitation

## 2020-01-09 MED FILL — AMLODIPINE BESYLATE 5 MG TA: 5 | 90 days supply | Qty: 180 | Fill #1

## 2020-01-09 NOTE — Telephone Encounter (Signed)
Called patient to check status. Voicemail not set up.

## 2020-01-14 ENCOUNTER — Other Ambulatory Visit: Payer: Self-pay | Admitting: Family Medicine

## 2020-01-14 ENCOUNTER — Other Ambulatory Visit: Payer: Self-pay | Admitting: Orthopedic Surgery

## 2020-01-14 DIAGNOSIS — Z1231 Encounter for screening mammogram for malignant neoplasm of breast: Secondary | ICD-10-CM

## 2020-01-14 MED FILL — VENLAFAXINE HCL ER 75 MG CA: 75 | 90 days supply | Qty: 180 | Fill #0

## 2020-01-14 MED FILL — MELOXICAM 15 MG TABLET: 15 | 30 days supply | Qty: 30 | Fill #0

## 2020-01-14 NOTE — Telephone Encounter (Signed)
Please advise. Thanks.  

## 2020-01-16 ENCOUNTER — Other Ambulatory Visit: Payer: Self-pay

## 2020-01-16 ENCOUNTER — Ambulatory Visit
Admission: RE | Admit: 2020-01-16 | Discharge: 2020-01-16 | Disposition: A | Discharge status: Home / Self Care | Payer: 59 | Source: Ambulatory Visit | Attending: Family Medicine | Admitting: Family Medicine

## 2020-01-16 DIAGNOSIS — Z1231 Encounter for screening mammogram for malignant neoplasm of breast: Secondary | ICD-10-CM

## 2020-01-21 ENCOUNTER — Other Ambulatory Visit: Payer: Self-pay | Admitting: Family Medicine

## 2020-01-21 DIAGNOSIS — F4323 Adjustment disorder with mixed anxiety and depressed mood: Secondary | ICD-10-CM | POA: Diagnosis not present

## 2020-01-21 DIAGNOSIS — F909 Attention-deficit hyperactivity disorder, unspecified type: Secondary | ICD-10-CM | POA: Diagnosis not present

## 2020-01-21 DIAGNOSIS — R928 Other abnormal and inconclusive findings on diagnostic imaging of breast: Secondary | ICD-10-CM

## 2020-01-23 ENCOUNTER — Ambulatory Visit: Payer: 59

## 2020-01-24 ENCOUNTER — Other Ambulatory Visit: Payer: Self-pay | Admitting: Surgical

## 2020-01-24 DIAGNOSIS — G4733 Obstructive sleep apnea (adult) (pediatric): Secondary | ICD-10-CM | POA: Diagnosis not present

## 2020-01-24 MED FILL — GABAPENTIN 300 MG CAPSULE: 300 | 10 days supply | Qty: 30 | Fill #0

## 2020-01-24 MED FILL — tiZANidine HCL 4 MG TABS: 4 | 30 days supply | Qty: 30 | Fill #1

## 2020-01-24 NOTE — Telephone Encounter (Signed)
Pls advise.  

## 2020-01-30 ENCOUNTER — Other Ambulatory Visit (HOSPITAL_COMMUNITY): Payer: Self-pay | Admitting: Family Medicine

## 2020-01-30 MED FILL — VALSARTAN 160 MG TABLET: 160 | 30 days supply | Qty: 30 | Fill #0

## 2020-02-04 ENCOUNTER — Ambulatory Visit
Admission: RE | Admit: 2020-02-04 | Discharge: 2020-02-04 | Disposition: A | Discharge status: Home / Self Care | Payer: 59 | Source: Ambulatory Visit | Attending: Family Medicine | Admitting: Family Medicine

## 2020-02-04 ENCOUNTER — Other Ambulatory Visit: Payer: Self-pay

## 2020-02-04 DIAGNOSIS — R928 Other abnormal and inconclusive findings on diagnostic imaging of breast: Secondary | ICD-10-CM

## 2020-02-04 DIAGNOSIS — N6489 Other specified disorders of breast: Secondary | ICD-10-CM | POA: Diagnosis not present

## 2020-02-04 MED FILL — FLUARIX QUADRIVALENT 0.5 ML: 0.5 | 1 days supply | Qty: 1 | Fill #0

## 2020-02-11 ENCOUNTER — Other Ambulatory Visit: Payer: 59

## 2020-02-11 DIAGNOSIS — F909 Attention-deficit hyperactivity disorder, unspecified type: Secondary | ICD-10-CM | POA: Diagnosis not present

## 2020-02-11 DIAGNOSIS — F4323 Adjustment disorder with mixed anxiety and depressed mood: Secondary | ICD-10-CM | POA: Diagnosis not present

## 2020-02-26 ENCOUNTER — Other Ambulatory Visit: Payer: Self-pay | Admitting: Surgical

## 2020-02-26 MED FILL — VALSARTAN 160 MG TABLET: 160 | 30 days supply | Qty: 30 | Fill #1

## 2020-02-26 MED FILL — GABAPENTIN 300 MG CAPSULE: 300 | 10 days supply | Qty: 30 | Fill #0

## 2020-02-26 NOTE — Telephone Encounter (Signed)
Do you wish to refill? 

## 2020-02-28 ENCOUNTER — Other Ambulatory Visit (HOSPITAL_COMMUNITY): Payer: Self-pay | Admitting: Family Medicine

## 2020-02-28 DIAGNOSIS — M1389 Other specified arthritis, multiple sites: Secondary | ICD-10-CM | POA: Diagnosis not present

## 2020-02-28 MED FILL — tiZANidine HCL 4 MG TABS: 4 | 30 days supply | Qty: 30 | Fill #0

## 2020-03-01 ENCOUNTER — Ambulatory Visit
Admission: EM | Admit: 2020-03-01 | Discharge: 2020-03-01 | Disposition: A | Discharge status: Home / Self Care | Payer: 59 | Attending: Emergency Medicine | Admitting: Emergency Medicine

## 2020-03-01 ENCOUNTER — Ambulatory Visit (INDEPENDENT_AMBULATORY_CARE_PROVIDER_SITE_OTHER): Payer: 59

## 2020-03-01 ENCOUNTER — Other Ambulatory Visit: Payer: Self-pay

## 2020-03-01 ENCOUNTER — Encounter: Payer: Self-pay | Admitting: Emergency Medicine

## 2020-03-01 DIAGNOSIS — W19XXXA Unspecified fall, initial encounter: Secondary | ICD-10-CM | POA: Diagnosis not present

## 2020-03-01 DIAGNOSIS — M2011 Hallux valgus (acquired), right foot: Secondary | ICD-10-CM | POA: Diagnosis not present

## 2020-03-01 DIAGNOSIS — M79671 Pain in right foot: Secondary | ICD-10-CM | POA: Diagnosis not present

## 2020-03-01 DIAGNOSIS — S99921A Unspecified injury of right foot, initial encounter: Secondary | ICD-10-CM | POA: Diagnosis not present

## 2020-03-01 NOTE — ED Provider Notes (Signed)
[facility]    CSN: [id number] Arrival date & time: 03/01/20  1051      History   Chief Complaint Chief Complaint  Patient presents with  . Foot Pain  . Fall    HPI Debra Mathews is a 54 y.o. female  With extensive medical history as below presenting for right foot pain s/p fall that occurred around 2 hours ago.  Was a ground-level: No head trauma or LOC.  Endorsing right low back pain that has been chronic: Seen by specialist for this and given steroid taper.  Feels this contributed to fall.  No saddle anesthesia, lower leg weakness or numbness.  Denies deformity.  Requesting boot given likely sprain, possible fracture, which she has history of.  Past Medical History:  Diagnosis Date  . 'light-for-dates' infant with signs of fetal malnutrition 11/01/2016  . Acne    adult  . Anxiety   . Arthritis 07/23/2019  . Chest pain 07/23/2019  . Elevated BP    no dx of HTN  . Fibroids   . History of prolonged Q-T interval on ECG 07/23/2019  . HTN (hypertension), benign 10/18/2011  . Iron deficiency anemia 10/18/2011   Hb 9.1 MCV 65.8 08/24/11 hx menorrhagia/fibroids/nl GYN exam  . Menorrhagia with regular cycle 10/18/2011  . Obesity   . Obesity, Class III, BMI 40-49.9 (morbid obesity) ([place]) 07/12/2016  . Shortness of breath 07/23/2019  . Sinus tachycardia     Patient Active Problem List   Diagnosis Date Noted  . Shortness of breath 07/23/2019  . Arthritis 07/23/2019  . History of prolonged Q-T interval on ECG 07/23/2019  . Chest pain 07/23/2019  . 'light-for-dates' infant with signs of fetal malnutrition 11/01/2016  . Obesity, Class III, BMI 40-49.9 (morbid obesity) ([place]) 07/12/2016  . Iron deficiency anemia 10/18/2011  . HTN (hypertension), benign 10/18/2011  . Menorrhagia with regular cycle 10/18/2011    Past Surgical History:  Procedure Laterality Date  . APPENDECTOMY    . MYOMECTOMY    . OVARIAN CYST REMOVAL    . ROOT CANAL  2018    OB History   No  obstetric history on file.      Home Medications    Prior to Admission medications   Medication Sig Start Date End Date Taking? Authorizing Provider  ALPRAZolam Duanne Moron) 0.5 MG tablet Take 0.5 mg by mouth at bedtime as needed for anxiety.    [provider]  amLODipine (NORVASC) 5 MG tablet Take 5 mg by mouth 2 (two) times daily. 10/14/19   [provider]  desvenlafaxine (PRISTIQ) 100 MG 24 hr tablet Take 1 tablet by mouth daily.    [provider]  diclofenac (FLECTOR) 1.3 % PTCH APPLY 1 PATCH TO SKIN TWICE A DAY EVERY 12 HOURS AS NEEDED FOR PAIN 08/24/17   [provider]  gabapentin (NEURONTIN) 300 MG capsule TAKE 1 CAPSULE BY MOUTH THREE TIMES DAILY. 02/26/20   Magnant, Gerrianne Scale, PA-C  hydroxychloroquine (PLAQUENIL) 200 MG tablet Take 400 mg by mouth daily. 09/03/19   [provider]  ipratropium (ATROVENT) 0.06 % nasal spray as needed. 04/16/19   [provider]  levocetirizine (XYZAL) 5 MG tablet Take 5 mg by mouth every evening. 08/07/19   [provider]  lisdexamfetamine (VYVANSE) 70 MG capsule Take 1 capsule by mouth in the morning.    [provider]  meloxicam (MOBIC) 15 MG tablet Take 1 tablet (15 mg total) by mouth daily. 10/17/19 10/16/20  Magnant, Gerrianne Scale,  PA-C  meloxicam (MOBIC) 15 MG tablet TAKE 1 TABLET BY MOUTH ONCE DAILY. 01/14/20   Magnant, Gerrianne Scale, PA-C  methylPREDNISolone (MEDROL DOSEPAK) 4 MG TBPK tablet Take as directed 12/18/19   Meredith Pel, MD  metoprolol tartrate (LOPRESSOR) 50 MG tablet Take 100 mg (two tablets) two hours before ct if heart rate greater than 55. 07/23/19   Tobb, Kardie, DO  nitroGLYCERIN (NITROSTAT) 0.4 MG SL tablet Place 1 tablet (0.4 mg total) under the tongue every 5 (five) minutes as needed for chest pain. 07/23/19 10/21/19  Tobb, Kardie, DO  silver sulfADIAZINE (SILVADENE) 1 % cream Apply 1 application topically daily.    [provider]  tiZANidine (ZANAFLEX) 4  MG tablet Take 4 mg by mouth daily as needed. 10/02/19   [provider]  traMADol (ULTRAM) 50 MG tablet Take 1-2 tablets (50-100 mg total) by mouth 2 (two) times daily as needed. 01/01/20   Magnant, Charles L, PA-C  valsartan (DIOVAN) 160 MG tablet Take 160 mg by mouth daily.    [provider]  VYVANSE 70 MG capsule Take 70 mg by mouth every morning. 08/23/17   [provider]  azelastine (ASTELIN) 0.1 % nasal spray Place 1 spray into both nostrils 2 (two) times daily. Use in each nostril as directed Patient not taking: Reported on 09/21/2018 06/27/18 02/23/19  Shella Maxim, NP  fluticasone [facility]) 50 MCG/ACT nasal spray Place 1 spray into both nostrils daily. 07/01/18 06/11/19  Chase Picket, MD  loratadine (CLARITIN) 10 MG tablet Take 10 mg by mouth as needed for allergies.   02/23/19  [provider]  sodium chloride (OCEAN) 0.65 % SOLN nasal spray Place 1 spray into both nostrils as needed for congestion. Patient not taking: Reported on 09/21/2018 07/01/18 02/23/19  Chase Picket, MD  venlafaxine [facility]) 75 MG tablet Take 75 mg by mouth daily.  02/23/19  [provider]    Family History Family History  Problem Relation Age of Onset  . Osteoarthritis Mother   . COPD Mother   . Diabetes Mother   . Hypertension Mother   . Cancer - Lung Mother   . Cancer - Colon Mother   . Cancer Mother        breast   . Osteoarthritis Father   . Diabetes Father   . Diabetes Brother   . Hypertension Brother   . Stroke Brother   . Hypertension Brother   . Raynaud syndrome Daughter     Social History Social History   Tobacco Use  . Smoking status: Never Smoker  . Smokeless tobacco: Never Used  Vaping Use  . Vaping Use: Never used  Substance Use Topics  . Alcohol use: No  . Drug use: No     Allergies   Oseltamivir   Review of Systems As per HPI   Physical Exam Triage Vital Signs ED Triage Vitals  Enc Vitals Group     BP       Pulse      Resp      Temp      Temp src      SpO2      Weight      Height      Head Circumference      Peak Flow      Pain Score      Pain Loc      Pain Edu?      Excl. in [place]?    No data found.  Updated  Vital Signs BP 138/83 (BP Location: Left Arm)   Pulse (!) 103   Temp 97.7 F (36.5 C) (Oral)   Resp 18   SpO2 96%   Visual Acuity Right Eye Distance:   Left Eye Distance:   Bilateral Distance:    Right Eye Near:   Left Eye Near:    Bilateral Near:     Physical Exam Constitutional:      General: She is not in acute distress. HENT:     Head: Normocephalic and atraumatic.  Eyes:     General: No scleral icterus.    Pupils: Pupils are equal, round, and reactive to light.  Cardiovascular:     Rate and Rhythm: Normal rate.  Pulmonary:     Effort: Pulmonary effort is normal.  Musculoskeletal:        General: Tenderness present. No swelling or deformity. Normal range of motion.     Comments: Plantar foot tenderness, distally near MTP.  Bunion noted: Chronic/stable.  Patient also notes third and fourth toe tenderness.  No medial or lateral malleoli tenderness.  Neurovascular intact.  Skin:    General: Skin is warm.     Capillary Refill: Capillary refill takes less than 2 seconds.     Coloration: Skin is not jaundiced or pale.     Findings: No bruising.  Neurological:     General: No focal deficit present.     Mental Status: She is alert and oriented to person, place, and time.      UC Treatments / Results  Labs (all labs ordered are listed, but only abnormal results are displayed) Labs Reviewed - No data to display  EKG   Radiology DG Foot Complete Right  Result Date: 03/01/2020 CLINICAL DATA:  Right foot pain after fall today. EXAM: RIGHT FOOT COMPLETE - 3+ VIEW COMPARISON:  June 11, 2019. FINDINGS: There is no evidence of fracture or dislocation. Moderate hallux valgus deformity of the first metatarsophalangeal joint is again noted. Soft tissues are  unremarkable. IMPRESSION: Moderate hallux valgus deformity of the first metatarsophalangeal joint. No acute abnormality seen in the right foot. Electronically Signed   By: Marijo Conception M.D.   On: 03/01/2020 12:08    Procedures Procedures (including critical care time)  Medications Ordered in UC Medications - No data to display  Initial Impression / Assessment and Plan / UC Course  I have reviewed the triage vital signs and the nursing notes.  Pertinent labs & imaging results that were available during my care of the patient were reviewed by me and considered in my medical decision making (see chart for details).     XR negative.  Cam walker applied, patient tolerated well.  Ortho contact information provided for follow-up.  Return precautions discussed, pt verbalized understanding and is agreeable to plan. Final Clinical Impressions(s) / UC Diagnoses   Final diagnoses:  Right foot injury, initial encounter  Fall, initial encounter     Discharge Instructions     RICE: rest, ice, compression, elevation as needed for pain.    Pain medication:  350 mg-1000 mg of Tylenol (acetaminophen) and/or 200 mg - 800 mg of Advil (ibuprofen, Motrin) every 8 hours as needed.  May alternate between the two throughout the day as they are generally safe to take together.  DO NOT exceed more than 3000 mg of Tylenol or 3200 mg of ibuprofen in a 24 hour period as this could damage your stomach, kidneys, liver, or increase your bleeding risk.  Important to follow  up with specialist(s) below for further evaluation/management if your symptoms persist or worsen.    ED Prescriptions    None     PDMP not reviewed this encounter.   [place], [place], [place] 03/01/20 1233

## 2020-03-01 NOTE — Discharge Instructions (Addendum)

## 2020-03-01 NOTE — ED Triage Notes (Signed)
Pt here for right foot pain after fall 2 hours ago; pt sts lower back pain also

## 2020-03-03 DIAGNOSIS — M2550 Pain in unspecified joint: Secondary | ICD-10-CM | POA: Diagnosis not present

## 2020-03-24 ENCOUNTER — Ambulatory Visit: Payer: 59 | Admitting: Orthopaedic Surgery

## 2020-03-25 ENCOUNTER — Encounter: Payer: Self-pay | Admitting: Orthopaedic Surgery

## 2020-03-25 ENCOUNTER — Ambulatory Visit: Payer: 59 | Admitting: Orthopaedic Surgery

## 2020-03-25 ENCOUNTER — Ambulatory Visit (INDEPENDENT_AMBULATORY_CARE_PROVIDER_SITE_OTHER): Payer: 59

## 2020-03-25 DIAGNOSIS — M25562 Pain in left knee: Secondary | ICD-10-CM

## 2020-03-25 DIAGNOSIS — S93402A Sprain of unspecified ligament of left ankle, initial encounter: Secondary | ICD-10-CM | POA: Diagnosis not present

## 2020-03-25 MED ORDER — METHYLPREDNISOLONE ACETATE 40 MG/ML IJ SUSP
40.0000 mg | INTRAMUSCULAR | Status: AC | PRN
  Administered 2020-03-25: 40 mg via INTRA_ARTICULAR

## 2020-03-25 MED ORDER — LIDOCAINE HCL 1 % IJ SOLN
3.0000 mL | INTRAMUSCULAR | Status: AC | PRN
  Administered 2020-03-25: 3 mL

## 2020-03-25 NOTE — Progress Notes (Signed)
Office Visit Note   Patient: Debra Mathews           Date of Birth: December 19, 1965           MRN: [id number] Visit Date: 03/25/2020              Requested by: Maurice Small, MD [place],  [place] [id number] PCP: Maurice Small, MD   Assessment & Plan: Visit Diagnoses:  1. Left knee pain, unspecified chronicity   2. Moderate left ankle sprain, initial encounter     Plan: I did feel it was reasonable to try a steroid injection in her left knee today.  I would have her use a cane in her opposite hand and try also a cam walking boot for the left ankle.  We can see her back in a month to make sure she is doing well.  All question concerns were answered and addressed.  Follow-Up Instructions: Return in about 4 weeks (around 04/22/2020).   Orders:  Orders Placed This Encounter  Procedures  . Large Joint Inj  . XR Knee 1-2 Views Left   No orders of the defined types were placed in this encounter.     Procedures: Large Joint Inj: L knee on 03/25/2020 4:02 PM Indications: diagnostic evaluation and pain Details: 22 G 1.5 in needle, superolateral approach  Arthrogram: No  Medications: 3 mL lidocaine 1 %; 40 mg methylPREDNISolone acetate 40 MG/ML Outcome: tolerated well, no immediate complications Procedure, treatment alternatives, risks and benefits explained, specific risks discussed. Consent was given by the patient. Immediately prior to procedure a time out was called to verify the correct patient, procedure, equipment, support staff and site/side marked as required. Patient was prepped and draped in the usual sterile fashion.       Clinical Data: No additional findings.   Subjective: Chief Complaint  Patient presents with  . Left Leg - Pain  Someone I am seeing for the first time today but she has seen partners of mine.  She has been having left knee and ankle pain since a fall 2 weeks ago.  She is report ankle swelling since then but mainly issues  around her left knee.  The knee pain is become pretty constant and occasionally she gets a sharp pain in that knee and some recurrent swelling.  She has tried a lace up ankle brace for her ankle at home and actually when she fell she had that ankle brace on.  She says is hard to get the brace on and off.  She has no other acute medical issues right now.  She has been dealing with severe lumbar spine stenosis in the past.  She does have a cane at home and I recommended she use that in her opposite hand on the right side as she is recovering from these recent injuries to her left knee and left ankle.  She is someone who is also morbidly obese weighing over 300 pounds. HPI  Review of Systems There is currently no headache or chest pain.  She denies any fever, chills, nausea, vomiting  Objective: Vital Signs: There were no vitals taken for this visit.  Physical Exam She is alert and orient x3 and in no acute distress Ortho Exam Examination of her left knee today shows the extensor mechanism is intact.  There is no instability on ligamentous exam.  There is no effusion.  She does have global tenderness of that knee.  The ankle has some  global swelling as well but no obvious deformities or instability. Specialty Comments:  No specialty comments available.  Imaging: XR Knee 1-2 Views Left  Result Date: 03/25/2020 2 views of the left knee show no acute findings.  There is patellofemoral arthritic changes.    PMFS History: Patient Active Problem List   Diagnosis Date Noted  . Shortness of breath 07/23/2019  . Arthritis 07/23/2019  . History of prolonged Q-T interval on ECG 07/23/2019  . Chest pain 07/23/2019  . 'light-for-dates' infant with signs of fetal malnutrition 11/01/2016  . Obesity, Class III, BMI 40-49.9 (morbid obesity) ([place]) 07/12/2016  . Iron deficiency anemia 10/18/2011  . HTN (hypertension), benign 10/18/2011  . Menorrhagia with regular cycle 10/18/2011   Past Medical  History:  Diagnosis Date  . 'light-for-dates' infant with signs of fetal malnutrition 11/01/2016  . Acne    adult  . Anxiety   . Arthritis 07/23/2019  . Chest pain 07/23/2019  . Elevated BP    no dx of HTN  . Fibroids   . History of prolonged Q-T interval on ECG 07/23/2019  . HTN (hypertension), benign 10/18/2011  . Iron deficiency anemia 10/18/2011   Hb 9.1 MCV 65.8 08/24/11 hx menorrhagia/fibroids/nl GYN exam  . Menorrhagia with regular cycle 10/18/2011  . Obesity   . Obesity, Class III, BMI 40-49.9 (morbid obesity) ([place]) 07/12/2016  . Shortness of breath 07/23/2019  . Sinus tachycardia     Family History  Problem Relation Age of Onset  . Osteoarthritis Mother   . COPD Mother   . Diabetes Mother   . Hypertension Mother   . Cancer - Lung Mother   . Cancer - Colon Mother   . Cancer Mother        breast   . Osteoarthritis Father   . Diabetes Father   . Diabetes Brother   . Hypertension Brother   . Stroke Brother   . Hypertension Brother   . Raynaud syndrome Daughter     Past Surgical History:  Procedure Laterality Date  . APPENDECTOMY    . MYOMECTOMY    . OVARIAN CYST REMOVAL    . ROOT CANAL  2018   Social History   Occupational History  . Not on file  Tobacco Use  . Smoking status: Never Smoker  . Smokeless tobacco: Never Used  Vaping Use  . Vaping Use: Never used  Substance and Sexual Activity  . Alcohol use: No  . Drug use: No  . Sexual activity: Not on file

## 2020-03-26 ENCOUNTER — Other Ambulatory Visit (HOSPITAL_COMMUNITY): Payer: Self-pay | Admitting: Surgical

## 2020-03-26 ENCOUNTER — Other Ambulatory Visit: Payer: Self-pay | Admitting: Surgical

## 2020-03-26 MED FILL — VYVANSE 70 MG CAPSULE: 70 | 90 days supply | Qty: 90 | Fill #0

## 2020-03-26 MED FILL — VALSARTAN 160 MG TABLET: 160 | 30 days supply | Qty: 30 | Fill #2

## 2020-03-26 MED FILL — GABAPENTIN 300 MG CAPSULE: 300 | 10 days supply | Qty: 30 | Fill #0

## 2020-03-26 NOTE — Telephone Encounter (Signed)
Please advise. Thanks.  

## 2020-03-31 ENCOUNTER — Other Ambulatory Visit (HOSPITAL_COMMUNITY): Payer: Self-pay | Admitting: Family Medicine

## 2020-03-31 DIAGNOSIS — S39012A Strain of muscle, fascia and tendon of lower back, initial encounter: Secondary | ICD-10-CM | POA: Diagnosis not present

## 2020-03-31 DIAGNOSIS — W108XXA Fall (on) (from) other stairs and steps, initial encounter: Secondary | ICD-10-CM | POA: Diagnosis not present

## 2020-03-31 DIAGNOSIS — R319 Hematuria, unspecified: Secondary | ICD-10-CM | POA: Diagnosis not present

## 2020-03-31 DIAGNOSIS — M1389 Other specified arthritis, multiple sites: Secondary | ICD-10-CM | POA: Diagnosis not present

## 2020-03-31 MED FILL — tiZANidine HCL 4 MG TABS: 4 | 30 days supply | Qty: 90 | Fill #0

## 2020-03-31 MED FILL — levoFLOXacin 500 MG TABS: 500 | 10 days supply | Qty: 10 | Fill #0

## 2020-03-31 MED FILL — GABAPENTIN 100 MG CAPSULE: 100 | 30 days supply | Qty: 90 | Fill #0

## 2020-04-08 ENCOUNTER — Other Ambulatory Visit (HOSPITAL_COMMUNITY): Payer: Self-pay | Admitting: Specialist

## 2020-04-08 MED FILL — VENLAFAXINE HCL ER 75 MG CA: 75 | 90 days supply | Qty: 180 | Fill #0

## 2020-04-14 MED FILL — AMLODIPINE BESYLATE 5 MG TA: 5 | 90 days supply | Qty: 180 | Fill #2

## 2020-04-15 ENCOUNTER — Other Ambulatory Visit (HOSPITAL_COMMUNITY): Payer: Self-pay | Admitting: Family Medicine

## 2020-04-15 MED FILL — CIPROFLOXACIN HCL 500 MG TA: 500 | 10 days supply | Qty: 10 | Fill #0

## 2020-04-24 MED FILL — VALSARTAN 160 MG TABLET: 160 | 30 days supply | Qty: 30 | Fill #3

## 2020-04-24 MED FILL — tiZANidine HCL 4 MG TABS: 4 | 30 days supply | Qty: 90 | Fill #1

## 2020-05-29 MED FILL — VALSARTAN 160 MG TABLET: 160 | 30 days supply | Qty: 30 | Fill #4

## 2020-07-02 MED FILL — AMLODIPINE BESYLATE 5 MG TA: 5 | 90 days supply | Qty: 180 | Fill #3

## 2020-07-02 MED FILL — VALSARTAN 160 MG TABLET: 160 | 30 days supply | Qty: 30 | Fill #5

## 2020-07-31 MED FILL — VALSARTAN 160 MG TABLET: 160 | 30 days supply | Qty: 30 | Fill #6

## 2020-07-31 MED FILL — LEVOCETIRIZINE 5 MG TABLET: 5 | 90 days supply | Qty: 90 | Fill #0

## 2020-08-28 ENCOUNTER — Other Ambulatory Visit (HOSPITAL_COMMUNITY): Payer: Self-pay

## 2020-08-28 MED FILL — Valsartan Tab 160 MG: ORAL | 30 days supply | Qty: 30 | Fill #0 | Status: AC

## 2020-09-22 ENCOUNTER — Other Ambulatory Visit (HOSPITAL_COMMUNITY): Payer: Self-pay

## 2020-09-22 MED FILL — Tizanidine HCl Tab 4 MG (Base Equivalent): ORAL | 30 days supply | Qty: 90 | Fill #0 | Status: AC

## 2020-09-23 ENCOUNTER — Other Ambulatory Visit (HOSPITAL_COMMUNITY): Payer: Self-pay

## 2020-09-24 ENCOUNTER — Other Ambulatory Visit (HOSPITAL_COMMUNITY): Payer: Self-pay

## 2020-09-25 ENCOUNTER — Other Ambulatory Visit (HOSPITAL_COMMUNITY): Payer: Self-pay

## 2020-09-25 MED ORDER — FLUCONAZOLE 150 MG PO TABS
150.0000 mg | ORAL_TABLET | ORAL | 1 refills | Status: DC
  Filled 2020-09-25: qty 4, 28d supply, fill #0

## 2020-09-28 ENCOUNTER — Other Ambulatory Visit (HOSPITAL_COMMUNITY): Payer: Self-pay

## 2020-09-28 MED FILL — Valsartan Tab 160 MG: ORAL | 30 days supply | Qty: 30 | Fill #1 | Status: AC

## 2020-09-29 ENCOUNTER — Other Ambulatory Visit (HOSPITAL_COMMUNITY): Payer: Self-pay

## 2020-09-30 ENCOUNTER — Other Ambulatory Visit (HOSPITAL_COMMUNITY): Payer: Self-pay

## 2020-10-07 ENCOUNTER — Other Ambulatory Visit (HOSPITAL_COMMUNITY): Payer: Self-pay

## 2020-10-12 ENCOUNTER — Other Ambulatory Visit (HOSPITAL_COMMUNITY): Payer: Self-pay

## 2020-10-14 ENCOUNTER — Other Ambulatory Visit (HOSPITAL_COMMUNITY): Payer: Self-pay

## 2020-10-15 ENCOUNTER — Other Ambulatory Visit (HOSPITAL_COMMUNITY): Payer: Self-pay

## 2020-10-15 MED ORDER — AMLODIPINE BESYLATE 5 MG PO TABS
2.0000 | ORAL_TABLET | Freq: Every day | ORAL | 3 refills | Status: DC
  Filled 2020-10-15: qty 180, 90d supply, fill #0

## 2020-10-30 ENCOUNTER — Other Ambulatory Visit (HOSPITAL_COMMUNITY): Payer: Self-pay

## 2020-10-30 MED FILL — Valsartan Tab 160 MG: ORAL | 30 days supply | Qty: 30 | Fill #2 | Status: AC

## 2020-11-13 ENCOUNTER — Other Ambulatory Visit (HOSPITAL_COMMUNITY): Payer: Self-pay

## 2020-11-13 MED ORDER — VYVANSE 70 MG PO CAPS
70.0000 mg | ORAL_CAPSULE | Freq: Every morning | ORAL | 0 refills | Status: DC
  Filled 2020-11-13: qty 90, 90d supply, fill #0
  Filled 2020-11-16: qty 30, 30d supply, fill #0
  Filled 2020-12-29: qty 30, 30d supply, fill #1

## 2020-11-16 ENCOUNTER — Other Ambulatory Visit (HOSPITAL_COMMUNITY): Payer: Self-pay

## 2020-11-23 ENCOUNTER — Other Ambulatory Visit (HOSPITAL_COMMUNITY): Payer: Self-pay

## 2020-11-23 MED ORDER — VENLAFAXINE HCL ER 150 MG PO CP24
150.0000 mg | ORAL_CAPSULE | Freq: Every day | ORAL | 1 refills | Status: AC
  Filled 2020-11-23: qty 30, 30d supply, fill #0
  Filled 2020-12-29: qty 30, 30d supply, fill #1
  Filled 2021-02-03: qty 30, 30d supply, fill #2

## 2020-11-23 MED FILL — Venlafaxine HCl Cap ER 24HR 75 MG (Base Equivalent): ORAL | 30 days supply | Qty: 60 | Fill #0 | Status: CN

## 2020-11-25 ENCOUNTER — Other Ambulatory Visit (HOSPITAL_COMMUNITY): Payer: Self-pay

## 2020-11-28 DIAGNOSIS — F3342 Major depressive disorder, recurrent, in full remission: Secondary | ICD-10-CM | POA: Diagnosis not present

## 2020-11-28 DIAGNOSIS — F900 Attention-deficit hyperactivity disorder, predominantly inattentive type: Secondary | ICD-10-CM | POA: Diagnosis not present

## 2020-12-01 ENCOUNTER — Other Ambulatory Visit (HOSPITAL_COMMUNITY): Payer: Self-pay

## 2020-12-01 MED FILL — Valsartan Tab 160 MG: ORAL | 30 days supply | Qty: 30 | Fill #3 | Status: AC

## 2020-12-02 ENCOUNTER — Other Ambulatory Visit (HOSPITAL_COMMUNITY): Payer: Self-pay

## 2020-12-09 ENCOUNTER — Other Ambulatory Visit (HOSPITAL_COMMUNITY): Payer: Self-pay

## 2020-12-09 DIAGNOSIS — N3000 Acute cystitis without hematuria: Secondary | ICD-10-CM | POA: Diagnosis not present

## 2020-12-09 DIAGNOSIS — M069 Rheumatoid arthritis, unspecified: Secondary | ICD-10-CM | POA: Diagnosis not present

## 2020-12-09 MED ORDER — CIPROFLOXACIN HCL 500 MG PO TABS
500.0000 mg | ORAL_TABLET | Freq: Two times a day (BID) | ORAL | 0 refills | Status: DC
  Filled 2020-12-09: qty 14, 7d supply, fill #0

## 2020-12-09 MED ORDER — DICLOFENAC EPOLAMINE 1.3 % EX PTCH
1.0000 | MEDICATED_PATCH | Freq: Two times a day (BID) | CUTANEOUS | 1 refills | Status: DC | PRN
  Filled 2020-12-09: qty 60, 30d supply, fill #0

## 2020-12-09 MED ORDER — ONDANSETRON HCL 8 MG PO TABS
8.0000 mg | ORAL_TABLET | Freq: Three times a day (TID) | ORAL | 1 refills | Status: DC | PRN
  Filled 2020-12-09: qty 30, 10d supply, fill #0

## 2020-12-14 ENCOUNTER — Other Ambulatory Visit (HOSPITAL_COMMUNITY): Payer: Self-pay

## 2020-12-14 MED ORDER — NITROFURANTOIN MACROCRYSTAL 100 MG PO CAPS
100.0000 mg | ORAL_CAPSULE | Freq: Every evening | ORAL | 0 refills | Status: DC
  Filled 2020-12-14: qty 14, 7d supply, fill #0

## 2020-12-15 ENCOUNTER — Other Ambulatory Visit (HOSPITAL_COMMUNITY): Payer: Self-pay

## 2020-12-15 MED ORDER — NITROFURANTOIN MACROCRYSTAL 100 MG PO CAPS
100.0000 mg | ORAL_CAPSULE | Freq: Two times a day (BID) | ORAL | 0 refills | Status: DC
  Filled 2020-12-15 (×3): qty 14, 7d supply, fill #0

## 2020-12-23 ENCOUNTER — Other Ambulatory Visit (HOSPITAL_COMMUNITY): Payer: Self-pay

## 2020-12-29 ENCOUNTER — Other Ambulatory Visit (HOSPITAL_COMMUNITY): Payer: Self-pay

## 2020-12-29 MED ORDER — VYVANSE 70 MG PO CAPS: 70.0000 mg | ORAL_CAPSULE | Freq: Every morning | ORAL | 0 refills | Status: DC

## 2020-12-29 MED ORDER — TIZANIDINE HCL 4 MG PO TABS
4.0000 mg | ORAL_TABLET | Freq: Three times a day (TID) | ORAL | 2 refills | Status: DC | PRN
  Filled 2020-12-29: qty 90, 30d supply, fill #0
  Filled 2021-04-22 – 2021-05-06 (×2): qty 90, 30d supply, fill #1

## 2020-12-29 MED ORDER — VYVANSE 70 MG PO CAPS
70.0000 mg | ORAL_CAPSULE | Freq: Every morning | ORAL | 0 refills | Status: DC
  Filled 2020-12-29: qty 30, 30d supply, fill #0

## 2020-12-30 ENCOUNTER — Other Ambulatory Visit (HOSPITAL_COMMUNITY): Payer: Self-pay

## 2020-12-31 ENCOUNTER — Other Ambulatory Visit (HOSPITAL_COMMUNITY): Payer: Self-pay

## 2020-12-31 MED FILL — Valsartan Tab 160 MG: ORAL | 30 days supply | Qty: 30 | Fill #4 | Status: AC

## 2021-01-29 ENCOUNTER — Other Ambulatory Visit (HOSPITAL_COMMUNITY): Payer: Self-pay

## 2021-02-01 ENCOUNTER — Other Ambulatory Visit (HOSPITAL_COMMUNITY): Payer: Self-pay

## 2021-02-02 ENCOUNTER — Other Ambulatory Visit (HOSPITAL_COMMUNITY): Payer: Self-pay

## 2021-02-02 MED ORDER — VALSARTAN 160 MG PO TABS
160.0000 mg | ORAL_TABLET | Freq: Every day | ORAL | 0 refills | Status: DC
  Filled 2021-02-02: qty 30, 30d supply, fill #0

## 2021-02-03 ENCOUNTER — Other Ambulatory Visit (HOSPITAL_COMMUNITY): Payer: Self-pay

## 2021-02-04 ENCOUNTER — Other Ambulatory Visit (HOSPITAL_COMMUNITY): Payer: Self-pay

## 2021-02-11 ENCOUNTER — Other Ambulatory Visit (HOSPITAL_COMMUNITY): Payer: Self-pay

## 2021-02-17 ENCOUNTER — Other Ambulatory Visit (HOSPITAL_COMMUNITY): Payer: Self-pay

## 2021-02-19 DIAGNOSIS — J0100 Acute maxillary sinusitis, unspecified: Secondary | ICD-10-CM | POA: Diagnosis not present

## 2021-02-19 DIAGNOSIS — R000 Tachycardia, unspecified: Secondary | ICD-10-CM | POA: Diagnosis not present

## 2021-04-23 ENCOUNTER — Other Ambulatory Visit (HOSPITAL_COMMUNITY): Payer: Self-pay

## 2021-05-04 ENCOUNTER — Other Ambulatory Visit (HOSPITAL_COMMUNITY): Payer: Self-pay

## 2021-05-06 ENCOUNTER — Other Ambulatory Visit (HOSPITAL_COMMUNITY): Payer: Self-pay

## 2021-07-20 ENCOUNTER — Other Ambulatory Visit: Payer: Self-pay

## 2021-11-17 ENCOUNTER — Emergency Department (HOSPITAL_BASED_OUTPATIENT_CLINIC_OR_DEPARTMENT_OTHER)
Admission: EM | Admit: 2021-11-17 | Discharge: 2021-11-17 | Disposition: A | Discharge status: Home / Self Care | Payer: Managed Care, Other (non HMO) | Attending: Emergency Medicine | Admitting: Emergency Medicine

## 2021-11-17 ENCOUNTER — Other Ambulatory Visit: Payer: Self-pay

## 2021-11-17 ENCOUNTER — Encounter (HOSPITAL_BASED_OUTPATIENT_CLINIC_OR_DEPARTMENT_OTHER): Payer: Self-pay | Admitting: Emergency Medicine

## 2021-11-17 ENCOUNTER — Ambulatory Visit
Admission: RE | Admit: 2021-11-17 | Discharge: 2021-11-17 | Disposition: A | Discharge status: Home / Self Care | Payer: Managed Care, Other (non HMO) | Source: Ambulatory Visit | Attending: Emergency Medicine | Admitting: Emergency Medicine

## 2021-11-17 VITALS — BP 130/67 | HR 115 | Temp 98.5°F | Resp 22

## 2021-11-17 DIAGNOSIS — Z7984 Long term (current) use of oral hypoglycemic drugs: Secondary | ICD-10-CM | POA: Diagnosis not present

## 2021-11-17 DIAGNOSIS — Z794 Long term (current) use of insulin: Secondary | ICD-10-CM | POA: Insufficient documentation

## 2021-11-17 DIAGNOSIS — R9431 Abnormal electrocardiogram [ECG] [EKG]: Secondary | ICD-10-CM

## 2021-11-17 DIAGNOSIS — R739 Hyperglycemia, unspecified: Secondary | ICD-10-CM

## 2021-11-17 DIAGNOSIS — R824 Acetonuria: Secondary | ICD-10-CM | POA: Diagnosis not present

## 2021-11-17 DIAGNOSIS — R000 Tachycardia, unspecified: Secondary | ICD-10-CM | POA: Insufficient documentation

## 2021-11-17 DIAGNOSIS — D72829 Elevated white blood cell count, unspecified: Secondary | ICD-10-CM | POA: Insufficient documentation

## 2021-11-17 DIAGNOSIS — R81 Glycosuria: Secondary | ICD-10-CM | POA: Diagnosis not present

## 2021-11-17 LAB — URINALYSIS, ROUTINE W REFLEX MICROSCOPIC
Glucose, UA: >=500 mg/dL — AB
Ketones, ur: 40 mg/dL — AB
Leukocytes,Ua: NEGATIVE
Nitrite: NEGATIVE
Protein, ur: NEGATIVE mg/dL
Specific Gravity, Urine: 1.02 (ref 1.005–1.030)
pH: 5 (ref 5.0–8.0)

## 2021-11-17 LAB — CBC
HCT: 44.9 % (ref 36.0–46.0)
Hemoglobin: 15 g/dL (ref 12.0–15.0)
MCH: 27.8 pg (ref 26.0–34.0)
MCHC: 33.4 g/dL (ref 30.0–36.0)
MCV: 83.3 fL (ref 80.0–100.0)
Platelets: 260 10*3/uL (ref 150–400)
RBC: 5.39 MIL/uL — ABNORMAL HIGH (ref 3.87–5.11)
RDW: 14.3 % (ref 11.5–15.5)
WBC: 11.6 10*3/uL — ABNORMAL HIGH (ref 4.0–10.5)
nRBC: 0 % (ref 0.0–0.2)

## 2021-11-17 LAB — POCT URINALYSIS DIP (MANUAL ENTRY)
Bilirubin, UA: NEGATIVE
Glucose, UA: >=1000 mg/dL — AB
Leukocytes, UA: NEGATIVE
Nitrite, UA: NEGATIVE
Protein Ur, POC: NEGATIVE mg/dL
Spec Grav, UA: 1.01 (ref 1.010–1.025)
Urobilinogen, UA: 0.2 E.U./dL
pH, UA: 5.5 (ref 5.0–8.0)

## 2021-11-17 LAB — POCT FASTING CBG KUC MANUAL ENTRY: POCT Glucose (KUC): 493 mg/dL — AB (ref 70–99)

## 2021-11-17 LAB — CBG MONITORING, ED
Glucose-Capillary: 446 mg/dL — ABNORMAL HIGH (ref 70–99)
Glucose-Capillary: 273 mg/dL — ABNORMAL HIGH (ref 70–99)

## 2021-11-17 LAB — I-STAT VENOUS BLOOD GAS, ED
Acid-Base Excess: 1 mmol/L (ref 0.0–2.0)
Bicarbonate: 26.6 mmol/L (ref 20.0–28.0)
Calcium, Ion: 1.28 mmol/L (ref 1.15–1.40)
HCT: 45 % (ref 36.0–46.0)
Hemoglobin: 15.3 g/dL — ABNORMAL HIGH (ref 12.0–15.0)
O2 Saturation: 52 %
Patient temperature: 98.6
Potassium: 4.4 mmol/L (ref 3.5–5.1)
Sodium: 135 mmol/L (ref 135–145)
TCO2: 28 mmol/L (ref 22–32)
pCO2, Ven: 44.4 mmHg (ref 44–60)
pH, Ven: 7.385 (ref 7.25–7.43)
pO2, Ven: 28 mmHg — CL (ref 32–45)

## 2021-11-17 LAB — BASIC METABOLIC PANEL
Anion gap: 8 (ref 5–15)
BUN: 9 mg/dL (ref 6–20)
CO2: 25 mmol/L (ref 22–32)
Calcium: 10 mg/dL (ref 8.9–10.3)
Chloride: 100 mmol/L (ref 98–111)
Creatinine, Ser: 1.17 mg/dL — ABNORMAL HIGH (ref 0.44–1.00)
GFR, Estimated: 55 mL/min — ABNORMAL LOW (ref >60)
Glucose, Bld: 395 mg/dL — ABNORMAL HIGH (ref 70–99)
Potassium: 4.3 mmol/L (ref 3.5–5.1)
Sodium: 133 mmol/L — ABNORMAL LOW (ref 135–145)

## 2021-11-17 LAB — BETA-HYDROXYBUTYRIC ACID: Beta-Hydroxybutyric Acid: 0.69 mmol/L — ABNORMAL HIGH (ref 0.05–0.27)

## 2021-11-17 LAB — URINALYSIS, MICROSCOPIC (REFLEX): WBC, UA: NONE SEEN WBC/hpf (ref 0–5)

## 2021-11-17 MED ORDER — SODIUM CHLORIDE 0.9 % IV BOLUS
1000.0000 mL | Freq: Once | INTRAVENOUS | Status: AC
  Administered 2021-11-17: 1000 mL via INTRAVENOUS

## 2021-11-17 MED ORDER — GLIPIZIDE 5 MG PO TABS: 2.5000 mg | ORAL_TABLET | Freq: Every day | ORAL | 0 refills | Status: DC

## 2021-11-17 MED ORDER — INSULIN ASPART 100 UNIT/ML IJ SOLN
10.0000 [IU] | Freq: Once | INTRAMUSCULAR | Status: AC
Start: 2021-11-17 — End: 2021-11-17
  Administered 2021-11-17: 10 [IU] via SUBCUTANEOUS

## 2021-11-17 MED ORDER — BLOOD GLUCOSE MONITOR KIT: PACK | 0 refills | Status: AC

## 2021-11-17 MED ORDER — METFORMIN HCL 1000 MG PO TABS: 1000.0000 mg | ORAL_TABLET | Freq: Two times a day (BID) | ORAL | 0 refills | Status: DC

## 2021-11-17 NOTE — ED Notes (Signed)
Patient is declining transport via EMS services. Reports that her daughter will pick her up and take her to the ED

## 2021-11-17 NOTE — Discharge Instructions (Addendum)
Your blood test did not show any evidence of DKA.  I recommend you start on metformin as well as low-dose glipizide 2.5 mg daily.  Your doctor may need to increase these medications depending on how your blood sugars respond.  Call your primary care doctor tomorrow morning to schedule an appointment this week.  Continue to check your blood sugars at home.  Return back to the ER if your blood sugars continue to be elevated or if you have sensations of dehydration worsening symptoms or any additional concerns.

## 2021-11-17 NOTE — Discharge Instructions (Signed)
Please go to the emergency room now for evaluation of your elevated blood sugar, sugar and ketones in your urine, tachycardia and abnormal EKG.

## 2021-11-17 NOTE — ED Provider Notes (Signed)
[facility]    CSN: [id number] Arrival date & time: 11/17/21  1448    HISTORY   Chief Complaint  Patient presents with   Urinary Frequency    Urine culture pending from PCP office. Taking macrobid. Feel dehydrated. - Entered by patient   HPI Debra Mathews is a pleasant, 56 y.o. female who presents to urgent care today complaining of Her blood sugar being possibly elevated.  Patient states that yesterday she had a urinalysis performed and was started on Macrobid for possible urinary tract infection.  Patient states that her increased frequency of urination has persisted since starting Macrobid.  On arrival, patient had a heart rate of 140 but after resting in the exam room chair for a few minutes this decreased to 115, respirations remained high at 22.  Patient pulse ox 96%.  CBG on arrival was 493, repeat urinalysis revealed greater than 1000 mg/dL and ketones 15 mg/dL.  The history is provided by the patient.   Past Medical History:  Diagnosis Date   'light-for-dates' infant with signs of fetal malnutrition 11/01/2016   Acne    adult   Anxiety    Arthritis 07/23/2019   Chest pain 07/23/2019   Elevated BP    no dx of HTN   Fibroids    History of prolonged Q-T interval on ECG 07/23/2019   HTN (hypertension), benign 10/18/2011   Iron deficiency anemia 10/18/2011   Hb 9.1 MCV 65.8 08/24/11 hx menorrhagia/fibroids/nl GYN exam   Menorrhagia with regular cycle 10/18/2011   Obesity    Obesity, Class III, BMI 40-49.9 (morbid obesity) ([place]) 07/12/2016   Shortness of breath 07/23/2019   Sinus tachycardia    Patient Active Problem List   Diagnosis Date Noted   Shortness of breath 07/23/2019   Arthritis 07/23/2019   History of prolonged Q-T interval on ECG 07/23/2019   Chest pain 07/23/2019   'light-for-dates' infant with signs of fetal malnutrition 11/01/2016   Obesity, Class III, BMI 40-49.9 (morbid obesity) ([place]) 07/12/2016   Iron deficiency anemia 10/18/2011   HTN  (hypertension), benign 10/18/2011   Menorrhagia with regular cycle 10/18/2011   Past Surgical History:  Procedure Laterality Date   APPENDECTOMY     MYOMECTOMY     OVARIAN CYST REMOVAL     ROOT CANAL  2018   OB History   No obstetric history on file.    Home Medications    Prior to Admission medications   Medication Sig Start Date End Date Taking? Authorizing Provider  amLODipine (NORVASC) 5 MG tablet TAKE 2 TABLETS BY MOUTH DAILY. 10/14/19 10/13/20  Maurice Small, MD  ipratropium (ATROVENT) 0.06 % nasal spray as needed. 04/16/19   [provider]  levocetirizine (XYZAL) 5 MG tablet Take 5 mg by mouth every evening. 08/07/19   [provider]  levocetirizine (XYZAL) 5 MG tablet TAKE 1 TABLET BY MOUTH EVERY EVENING. 11/05/19 11/04/20  Maurice Small, MD  nitroGLYCERIN (NITROSTAT) 0.4 MG SL tablet Place 1 tablet (0.4 mg total) under the tongue every 5 (five) minutes as needed for chest pain. 07/23/19 10/21/19  Tobb, Kardie, DO  sulfaSALAzine (AZULFIDINE) 500 MG tablet Take 1,000 mg by mouth 2 (two) times daily. 10/12/21   [provider]  valsartan (DIOVAN) 160 MG tablet Take 160 mg by mouth daily.    [provider]  valsartan (DIOVAN) 160 MG tablet TAKE 1 TABLET BY MOUTH ONCE DAILY 01/30/20 01/29/21  Maurice Small, MD  venlafaxine XR (EFFEXOR-XR) 150 MG 24 hr capsule  Take 1 capsule (150 mg total) by mouth daily. 11/23/20   Chucky May, MD  azelastine (ASTELIN) 0.1 % nasal spray Place 1 spray into both nostrils 2 (two) times daily. Use in each nostril as directed Patient not taking: Reported on 09/21/2018 06/27/18 02/23/19  Shella Maxim, NP  fluticasone [facility]) 50 MCG/ACT nasal spray Place 1 spray into both nostrils daily. 07/01/18 06/11/19  Chase Picket, MD  loratadine (CLARITIN) 10 MG tablet Take 10 mg by mouth as needed for allergies.   02/23/19  [provider]  sodium chloride (OCEAN) 0.65 % SOLN nasal spray Place 1 spray into both nostrils as needed  for congestion. Patient not taking: Reported on 09/21/2018 07/01/18 02/23/19  Lamptey, Myrene Galas, MD    Family History Family History  Problem Relation Age of Onset   Osteoarthritis Mother    COPD Mother    Diabetes Mother    Hypertension Mother    Cancer - Lung Mother    Cancer - Colon Mother    Cancer Mother        breast    Osteoarthritis Father    Diabetes Father    Diabetes Brother    Hypertension Brother    Stroke Brother    Hypertension Brother    Raynaud syndrome Daughter    Social History Social History   Tobacco Use   Smoking status: Never   Smokeless tobacco: Never  Vaping Use   Vaping Use: Never used  Substance Use Topics   Alcohol use: No   Drug use: No   Allergies   Oseltamivir  Review of Systems Review of Systems Pertinent findings revealed after performing a 14 point review of systems has been noted in the history of present illness.  Physical Exam Triage Vital Signs ED Triage Vitals  Enc Vitals Group     BP 03/05/21 0827 (!) 147/82     Pulse Rate 03/05/21 0827 72     Resp 03/05/21 0827 18     Temp 03/05/21 0827 98.3 F (36.8 C)     Temp Source 03/05/21 0827 Oral     SpO2 03/05/21 0827 98 %     Weight --      Height --      Head Circumference --      Peak Flow --      Pain Score 03/05/21 0826 5     Pain Loc --      Pain Edu? --      Excl. in [place]? --   No data found.  Updated Vital Signs BP 130/67 (BP Location: Right Arm)   Pulse (!) 115   Temp 98.5 F (36.9 C) (Oral)   Resp (!) 22   SpO2 96%   Physical Exam Vitals and nursing note reviewed.  Constitutional:      General: She is awake. She is not in acute distress.    Appearance: Normal appearance. She is well-developed. She is morbidly obese. She is ill-appearing, toxic-appearing and diaphoretic.  HENT:     Head: Normocephalic and atraumatic.  Eyes:     General: Lids are normal.        Right eye: No discharge.        Left eye: No discharge.     Extraocular Movements:  Extraocular movements intact.     Conjunctiva/sclera: Conjunctivae normal.     Right eye: Right conjunctiva is not injected.     Left eye: Left conjunctiva is not injected.  Neck:     Trachea: Trachea  and phonation normal.  Cardiovascular:     Rate and Rhythm: Normal rate and regular rhythm.     Pulses: Normal pulses.     Heart sounds: Normal heart sounds. No murmur heard.    No friction rub. No gallop.  Pulmonary:     Effort: Pulmonary effort is normal. No accessory muscle usage, prolonged expiration or respiratory distress.     Breath sounds: Normal breath sounds. No stridor, decreased air movement or transmitted upper airway sounds. No decreased breath sounds, wheezing, rhonchi or rales.  Chest:     Chest wall: No tenderness.  Abdominal:     General: Abdomen is flat. Bowel sounds are normal. There is no distension.     Palpations: Abdomen is soft.     Tenderness: There is no abdominal tenderness. There is no right CVA tenderness or left CVA tenderness.     Hernia: No hernia is present.  Musculoskeletal:        General: Normal range of motion.     Cervical back: Normal range of motion and neck supple. Normal range of motion.  Lymphadenopathy:     Cervical: No cervical adenopathy.  Skin:    General: Skin is warm.     Findings: No erythema or rash.  Neurological:     General: No focal deficit present.     Mental Status: She is alert and oriented to person, place, and time.  Psychiatric:        Attention and Perception: Attention and perception normal.        Mood and Affect: Mood is anxious. Affect is inappropriate.        Speech: Speech normal.        Behavior: Behavior is cooperative.        Thought Content: Thought content normal.        Cognition and Memory: Cognition and memory normal.        Judgment: Judgment is inappropriate.     Visual Acuity Right Eye Distance:   Left Eye Distance:   Bilateral Distance:    Right Eye Near:   Left Eye Near:    Bilateral  Near:     UC Couse / Diagnostics / Procedures:     Radiology No results found.  Procedures Procedures (including critical care time) EKG  Pending results:  Labs Reviewed  POCT URINALYSIS DIP (MANUAL ENTRY) - Abnormal; Notable for the following components:      Result Value   Glucose, UA >=1,000 (*)    Ketones, POC UA small (15) (*)    Blood, UA small (*)    All other components within normal limits  POCT FASTING CBG [facility] MANUAL ENTRY - Abnormal; Notable for the following components:   POCT Glucose ([facility]) 493 (*)    All other components within normal limits    Medications Ordered in UC: Medications - No data to display  UC Diagnoses / Final Clinical Impressions(s)   I have reviewed the triage vital signs and the nursing notes.  Pertinent labs & imaging results that were available during my care of the patient were reviewed by me and considered in my medical decision making (see chart for details).    Final diagnoses:  Glucosuria  Ketonuria  Hyperglycemia  Tachycardia  Abnormal finding on EKG   During visit today, patient states she has a primary care provider with [facility].    Patient had significantly elevated blood glucose level, reports last p.o. intake was 2 bananas 4 hours prior to CBG.  Patient also had a significant amount of sugar in her urine which was consistent with her previous urinalysis performed at [place].  Urine culture performed at [place] at this time is still pending.  Ketones were also present in her urine both at [place] and today as well.  EKG performed during her visit today revealed subtle T wave abnormalities, patient was advised of this as well.  Patient was advised that I absolutely recommend that she be transported via EMS to the emergency room for evaluation of possible diabetic ketoacidosis at this time.    Patient refused initially then stated that she probably should then changed her mind again and insisted on  calling her daughter to pick her up from urgent care and transport her by private vehicle.  Patient cited cost as a concern, states she did not believe her insurance will pay for the EMS transport which I find confusing giving that she is a primary care provider and likely can afford this cost.    Per my observation, patient not answering questions using complete sentences and jumbling words during her visit today.  I pointed this out to the patient but was unable to dissuade her from this decision to transport by private vehicle.    Patient was further advised that I recommend that she go to either [facility] or [facility] where she can be admitted if needed this may be a possibility.  Patient's daughter presented to urgent care to pick patient up and transport her, daughter was advised of these findings and recommendations by RN.  ED Prescriptions   None    PDMP not reviewed this encounter.  Pending results:  Labs Reviewed  POCT URINALYSIS DIP (MANUAL ENTRY) - Abnormal; Notable for the following components:      Result Value   Glucose, UA >=1,000 (*)    Ketones, POC UA small (15) (*)    Blood, UA small (*)    All other components within normal limits  POCT FASTING CBG [facility] MANUAL ENTRY - Abnormal; Notable for the following components:   POCT Glucose ([facility]) 493 (*)    All other components within normal limits    Discharge Instructions:   Discharge Instructions      Please go to the emergency room now for evaluation of your elevated blood sugar, sugar and ketones in your urine, tachycardia and abnormal EKG.    Disposition Upon Discharge:  Condition: stable for discharge home  Patient presented with an acute illness with associated systemic symptoms and significant discomfort requiring urgent management. In my opinion, this is a condition that a prudent lay person (someone who possesses an average knowledge of health and medicine) may potentially expect to result in complications  if not addressed urgently such as respiratory distress, impairment of bodily function or dysfunction of bodily organs.   Routine symptom specific, illness specific and/or disease specific instructions were discussed with the patient and/or caregiver at length.   As such, the patient has been evaluated and assessed, work-up was performed and treatment was provided in alignment with urgent care protocols and evidence based medicine.  Patient/parent/caregiver has been advised that the patient may require follow up for further testing and treatment if the symptoms continue in spite of treatment, as clinically indicated and appropriate.  Patient/parent/caregiver has been advised to return to the [facility] or PCP if no better; to PCP or the Emergency Department if new signs and symptoms develop, or if the current signs or symptoms continue to change  or worsen for further workup, evaluation and treatment as clinically indicated and appropriate  The patient will follow up with their current PCP if and as advised. If the patient does not currently have a PCP we will assist them in obtaining one.   The patient may need specialty follow up if the symptoms continue, in spite of conservative treatment and management, for further workup, evaluation, consultation and treatment as clinically indicated and appropriate.   Patient/parent/caregiver verbalized understanding and agreement of plan as discussed.  All questions were addressed during visit.  Please see discharge instructions below for further details of plan.  This office note has been dictated using Museum/gallery curator.  Unfortunately, this method of dictation can sometimes lead to typographical or grammatical errors.  I apologize for your inconvenience in advance if this occurs.  Please do not hesitate to reach out to me if clarification is needed.      Lynden Oxford Scal[place], [place] 11/17/21 [phone]

## 2021-11-17 NOTE — ED Notes (Signed)
Dr. Almyra Free at bedside Discussed patient's lab results and plan of care to include administration of insulin and another fluid bolus while patient is here. Plan is to discharge patient home with Metformin to start and follow up with PCP. Patient agrees with this plan.

## 2021-11-17 NOTE — ED Provider Notes (Addendum)
[place] EMERGENCY DEPARTMENT Provider Note   CSN: [id number] Arrival date & time: 11/17/21  1615     History  Chief Complaint  Patient presents with   Hyperglycemia    Debra Mathews is a 56 y.o. female.  Patient presents to the ER sent from the urgent care, chief complaint of elevated blood sugar.  She states she has had increased urinary frequency and increased thirst ongoing for the past 2 to 3 days.  Does not have a prior history of diabetes.  She has recently been on prednisone 50 mg a day, last use was 3 weeks ago however.  Otherwise denies any fevers or cough no vomiting or diarrhea denies any headache or chest pain or abdominal pain.       Home Medications Prior to Admission medications   Medication Sig Start Date End Date Taking? Authorizing Provider  blood glucose meter kit and supplies KIT Dispense based on patient and insurance preference. Use up to four times daily as directed. 11/17/21  Yes Luna Fuse, MD  glipiZIDE (GLUCOTROL) 5 MG tablet Take 0.5 tablets (2.5 mg total) by mouth daily before breakfast. 11/17/21 12/17/21 Yes Ameyah Bangura, Greggory Brandy, MD  metFORMIN (GLUCOPHAGE) 1000 MG tablet Take 1 tablet (1,000 mg total) by mouth 2 (two) times daily. 11/17/21  Yes Luna Fuse, MD  amLODipine (NORVASC) 5 MG tablet TAKE 2 TABLETS BY MOUTH DAILY. 10/14/19 10/13/20  Maurice Small, MD  ipratropium (ATROVENT) 0.06 % nasal spray as needed. 04/16/19   [provider]  levocetirizine (XYZAL) 5 MG tablet Take 5 mg by mouth every evening. 08/07/19   [provider]  levocetirizine (XYZAL) 5 MG tablet TAKE 1 TABLET BY MOUTH EVERY EVENING. 11/05/19 11/04/20  Maurice Small, MD  nitroGLYCERIN (NITROSTAT) 0.4 MG SL tablet Place 1 tablet (0.4 mg total) under the tongue every 5 (five) minutes as needed for chest pain. 07/23/19 10/21/19  Tobb, Kardie, DO  sulfaSALAzine (AZULFIDINE) 500 MG tablet Take 1,000 mg by mouth 2 (two) times daily. 10/12/21   [provider]   valsartan (DIOVAN) 160 MG tablet Take 160 mg by mouth daily.    [provider]  valsartan (DIOVAN) 160 MG tablet TAKE 1 TABLET BY MOUTH ONCE DAILY 01/30/20 01/29/21  Maurice Small, MD  venlafaxine XR (EFFEXOR-XR) 150 MG 24 hr capsule Take 1 capsule (150 mg total) by mouth daily. 11/23/20   Chucky May, MD  azelastine (ASTELIN) 0.1 % nasal spray Place 1 spray into both nostrils 2 (two) times daily. Use in each nostril as directed Patient not taking: Reported on 09/21/2018 06/27/18 02/23/19  Shella Maxim, NP  fluticasone [facility]) 50 MCG/ACT nasal spray Place 1 spray into both nostrils daily. 07/01/18 06/11/19  Chase Picket, MD  loratadine (CLARITIN) 10 MG tablet Take 10 mg by mouth as needed for allergies.   02/23/19  [provider]  sodium chloride (OCEAN) 0.65 % SOLN nasal spray Place 1 spray into both nostrils as needed for congestion. Patient not taking: Reported on 09/21/2018 07/01/18 02/23/19  Chase Picket, MD      Allergies    Oseltamivir    Review of Systems   Review of Systems  Constitutional:  Negative for fever.  HENT:  Negative for ear pain.   Eyes:  Negative for pain.  Respiratory:  Negative for cough.   Cardiovascular:  Negative for chest pain.  Gastrointestinal:  Negative for abdominal pain.  Genitourinary:  Negative for flank pain.  Musculoskeletal:  Negative for back  pain.  Skin:  Negative for rash.  Neurological:  Negative for headaches.    Physical Exam Updated Vital Signs BP 130/62   Pulse 95   Temp 99.3 F (37.4 C)   Resp 16   Ht $R'5\' 6"'PL$  (1.676 m)   Wt (!) 143.8 kg   SpO2 100%   BMI 51.17 kg/m  Physical Exam Constitutional:      General: She is not in acute distress.    Appearance: Normal appearance.  HENT:     Head: Normocephalic.     Nose: Nose normal.  Eyes:     Extraocular Movements: Extraocular movements intact.  Cardiovascular:     Rate and Rhythm: Normal rate.  Pulmonary:     Effort: Pulmonary effort is normal.   Musculoskeletal:        General: Normal range of motion.     Cervical back: Normal range of motion.  Neurological:     General: No focal deficit present.     Mental Status: She is alert and oriented to person, place, and time. Mental status is at baseline.     ED Results / Procedures / Treatments   Labs (all labs ordered are listed, but only abnormal results are displayed) Labs Reviewed  CBC - Abnormal; Notable for the following components:      Result Value   WBC 11.6 (*)    RBC 5.39 (*)    All other components within normal limits  URINALYSIS, ROUTINE W REFLEX MICROSCOPIC - Abnormal; Notable for the following components:   APPearance HAZY (*)    Glucose, UA >=500 (*)    Hgb urine dipstick SMALL (*)    Bilirubin Urine SMALL (*)    Ketones, ur 40 (*)    All other components within normal limits  BASIC METABOLIC PANEL - Abnormal; Notable for the following components:   Sodium 133 (*)    Glucose, Bld 395 (*)    Creatinine, Ser 1.17 (*)    GFR, Estimated 55 (*)    All other components within normal limits  URINALYSIS, MICROSCOPIC (REFLEX) - Abnormal; Notable for the following components:   Bacteria, UA RARE (*)    All other components within normal limits  CBG MONITORING, ED - Abnormal; Notable for the following components:   Glucose-Capillary 446 (*)    All other components within normal limits  I-STAT VENOUS BLOOD GAS, ED - Abnormal; Notable for the following components:   pO2, Ven 28 (*)    Hemoglobin 15.3 (*)    All other components within normal limits  CBG MONITORING, ED - Abnormal; Notable for the following components:   Glucose-Capillary 273 (*)    All other components within normal limits  BETA-HYDROXYBUTYRIC ACID    EKG EKG Interpretation  Date/Time:  Wednesday November 17 2021 16:49:49 EDT Ventricular Rate:  106 PR Interval:  164 QRS Duration: 64 QT Interval:  314 QTC Calculation: 417 R Axis:   71 Text Interpretation: Sinus tachycardia Right atrial  enlargement Nonspecific T wave abnormality Abnormal ECG No previous ECGs available Confirmed by Thamas Jaegers ([id number]) on 11/17/2021 5:57:55 PM  Radiology No results found.  Procedures Procedures    Medications Ordered in ED Medications  sodium chloride 0.9 % bolus 1,000 mL (0 mLs Intravenous Stopped 11/17/21 1953)  sodium chloride 0.9 % bolus 1,000 mL (1,000 mLs Intravenous New Bag/Given 11/17/21 2028)  insulin aspart (novoLOG) injection 10 Units (10 Units Subcutaneous Given 11/17/21 2022)    ED Course/ Medical Decision Making/ A&P  Medical Decision Making Amount and/or Complexity of Data Reviewed Labs: ordered.  Risk Prescription drug management.   Review of record shows outpatient urgent care visit earlier today.  Cardiac monitoring showing sinus rhythm, tachycardic rate.  Labs were sent chemistry is unremarkable anion gap is 8 bicarb is 25.  pH on VBG is 7.38.  White count of 11 hemoglobin 15 blood sugar was 446.  However no evidence of DKA at this time.  Patient given 2 L bolus of fluids and an insulin with improvement of symptoms.  Subcu insulin provided as well, repeat blood sugar 276.  Will give prescription of metformin as well as glipizide.  Recommend patient follow-up with her primary care doctor this week, she will need to call tomorrow morning.  Advised immediate return for blood sugars continue to be elevated or out-of-control or chest any additional concerns or symptoms.   Final Clinical Impression(s) / ED Diagnoses Final diagnoses:  Hyperglycemia    Rx / DC Orders ED Discharge Orders          Ordered    blood glucose meter kit and supplies KIT        11/17/21 2130    metFORMIN (GLUCOPHAGE) 1000 MG tablet  2 times daily        11/17/21 2130    glipiZIDE (GLUCOTROL) 5 MG tablet  Daily before breakfast        11/17/21 2130              Luna Fuse, MD 11/17/21 2007    Luna Fuse, MD 11/17/21 2131

## 2021-11-17 NOTE — ED Triage Notes (Signed)
Had urinary frequency, PCP placed her on macrobid on Monday. Urinary frequency has persisted. She's concerned her blood sugar is elevated, but has not been diagnosed with diabetes. Presents breathing heavily, diaphoretic. Reports right CVA tenderness and nausea, possibly febrile over the weekend.

## 2021-11-17 NOTE — ED Triage Notes (Addendum)
Pt reports urinary frequency for the past week. Went to PCP for this and was told she needed to come back for potential diagnosis of diabetes. CBG in 400s at urgent care today. Was on prednisone recently. Otherwise feels lightheaded.

## 2021-11-17 NOTE — ED Notes (Signed)
Patient is being discharged from the Urgent Care and sent to the Emergency Department via POV per patient request/refused EMS transport. Per Lynden Oxford PA, patient is in need of higher level of care due to hyperglycemia (CBG 493), diaphoresis, tachycardia. Patient is aware and verbalizes understanding of plan of care.  Vitals:   11/17/21 1504  BP: 130/67  Pulse: (!) 115  Resp: (!) 22  Temp: 98.5 F (36.9 C)  SpO2: 96%

## 2021-12-15 ENCOUNTER — Encounter (INDEPENDENT_AMBULATORY_CARE_PROVIDER_SITE_OTHER): Payer: Self-pay

## 2022-03-18 IMAGING — MG DIGITAL SCREENING BILAT W/ TOMO W/ CAD
6 of 10 series · 6 of 30 positions shown · non-contrast
Comparison: Previous exam(s).

CLINICAL DATA: Screening.

EXAM:
DIGITAL SCREENING BILATERAL MAMMOGRAM WITH TOMO AND CAD

[L CC synth-2D]
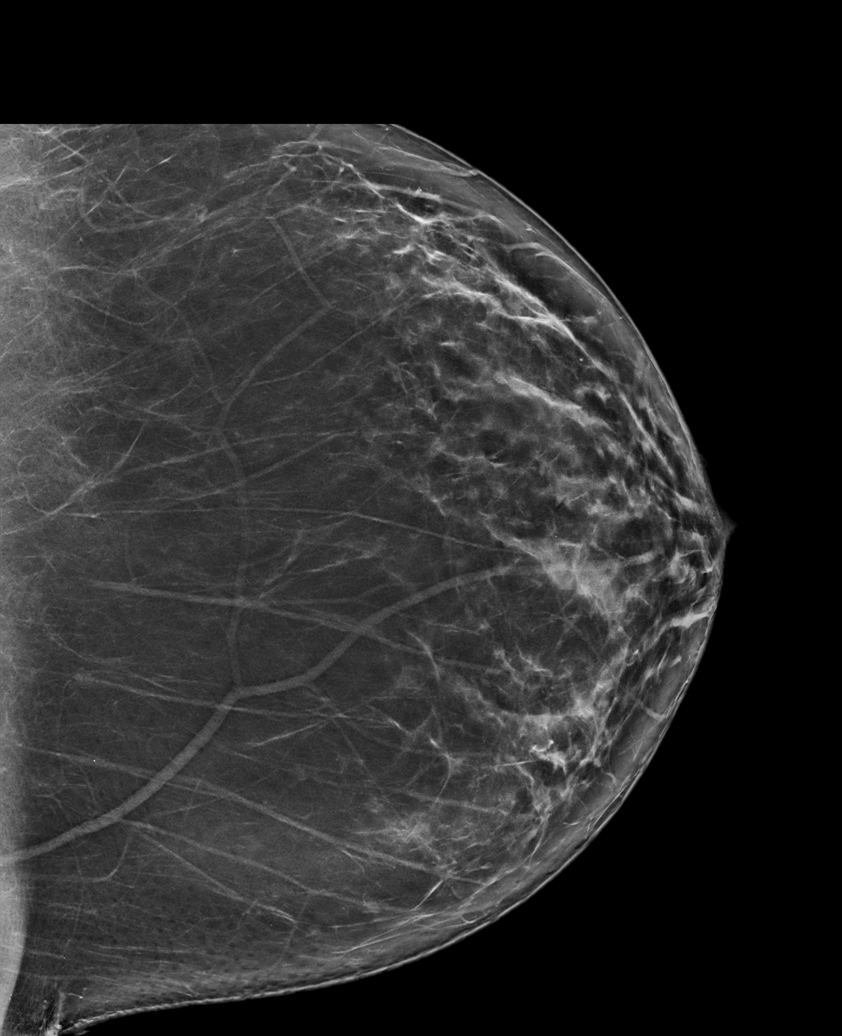

[R MLO synth-2D]
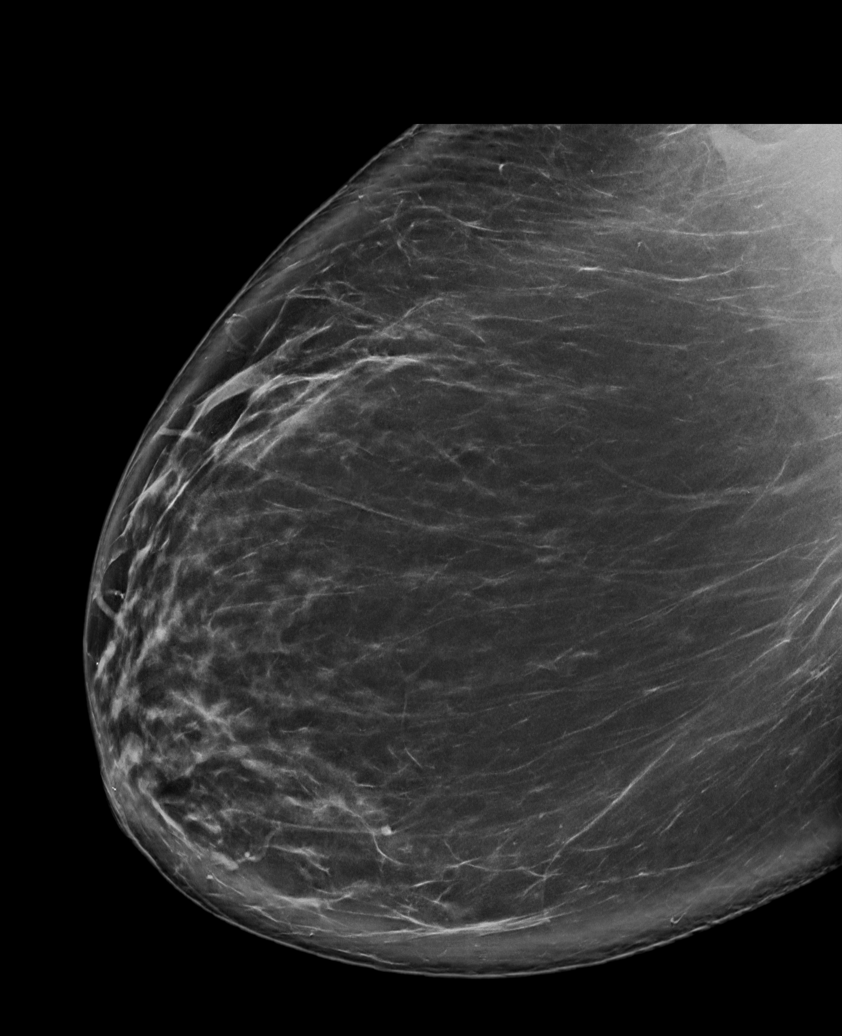

[L MLO synth-2D]
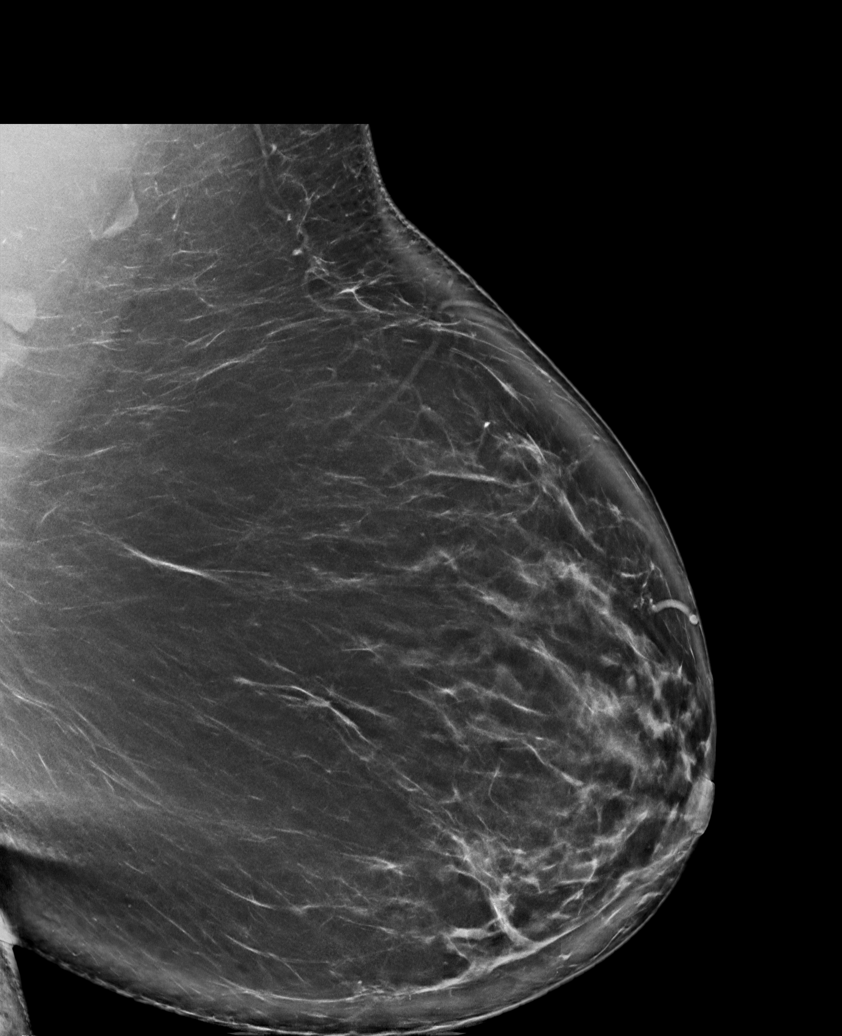

[R CV synth-2D]
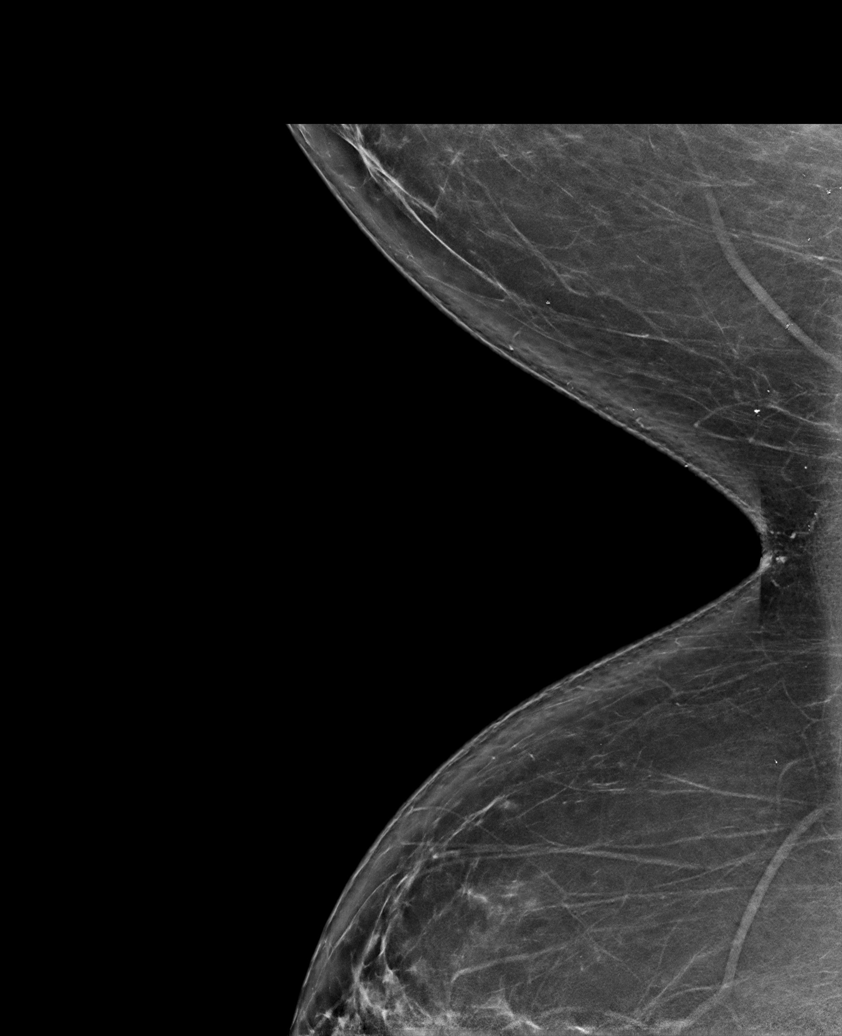

[R CC synth-2D]
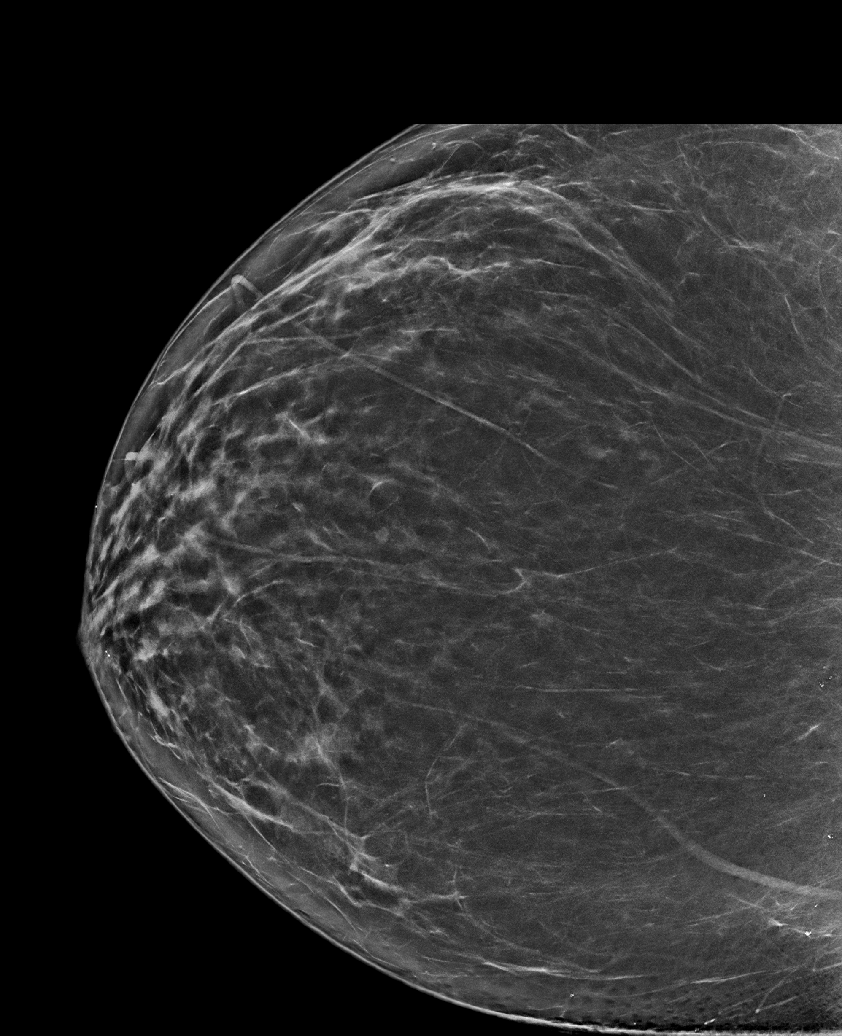

[R MLO tomo · tomo slice 57/113.0]
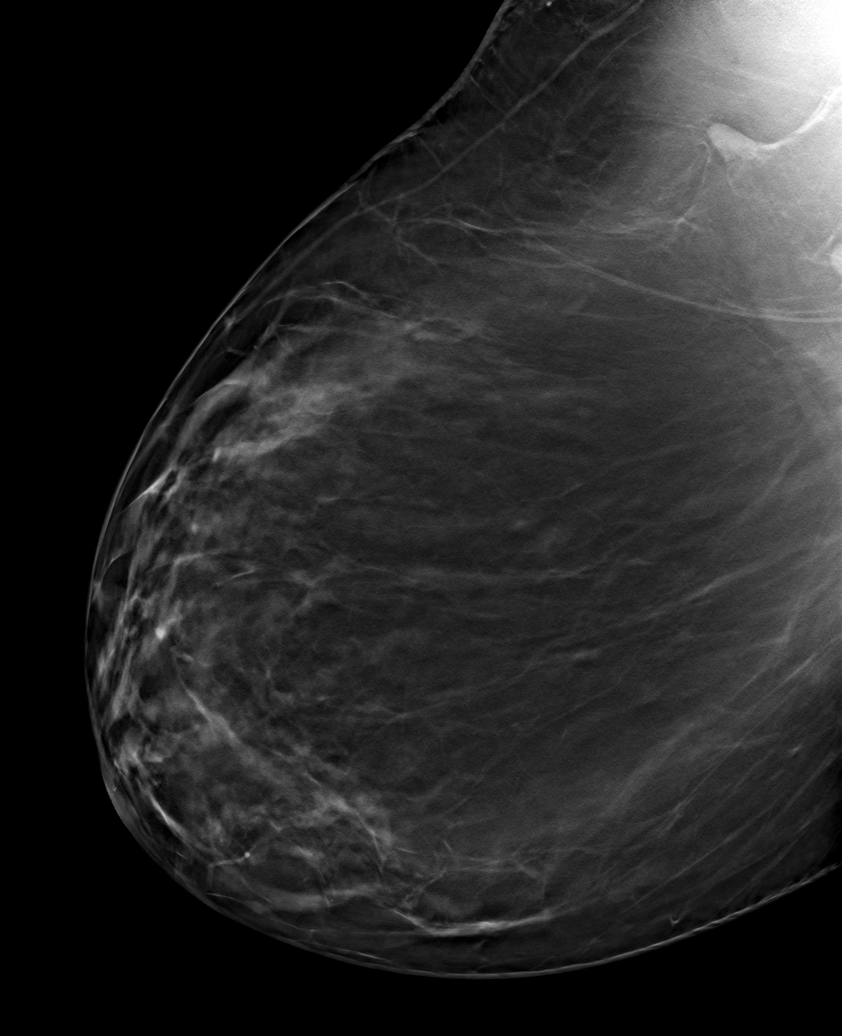

[6 of 30 positions shown; findings below may reference images not displayed]

ACR Breast Density Category b: There are scattered areas of
fibroglandular density.
FINDINGS: In the right breast, a possible asymmetry warrants further
evaluation. In the left breast, no findings suspicious for
malignancy. Images were processed with CAD.
IMPRESSION: Further evaluation is suggested for possible asymmetry in the right
breast.

RECOMMENDATION:
Diagnostic mammogram and possibly ultrasound of the right breast.
(Code:[id number]-U-55T)

The patient will be contacted regarding the findings, and additional
imaging will be scheduled.

BI-RADS CATEGORY  0: Incomplete. Need additional imaging evaluation
and/or prior mammograms for comparison.

## 2022-04-06 IMAGING — MG MM DIGITAL DIAGNOSTIC UNILAT*R* W/ TOMO W/ CAD
6 series · 6 of 18 positions shown · non-contrast
Comparison: Previous exam(s).

CLINICAL DATA: Patient recalled from screening for right breast
asymmetry.

EXAM:
DIGITAL DIAGNOSTIC RIGHT MAMMOGRAM WITH CAD AND TOMO
ULTRASOUND RIGHT BREAST

[R ML synth-2D]
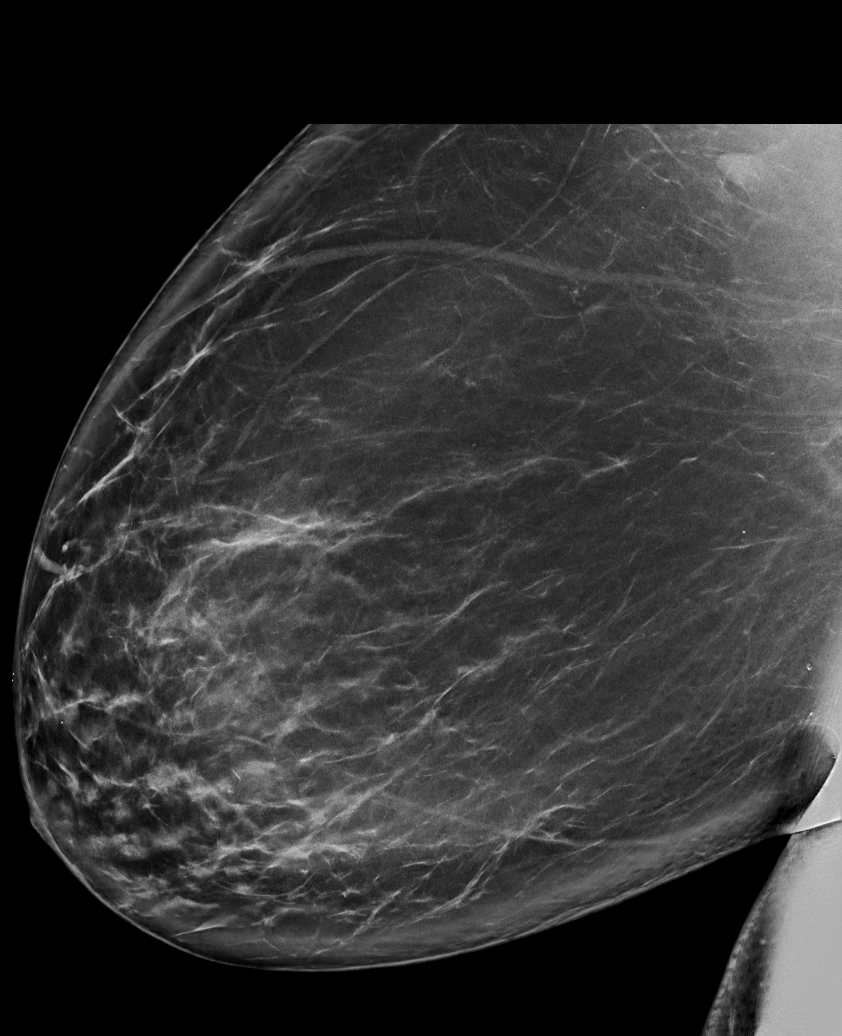

[R CC synth-2D (1 of 2)]
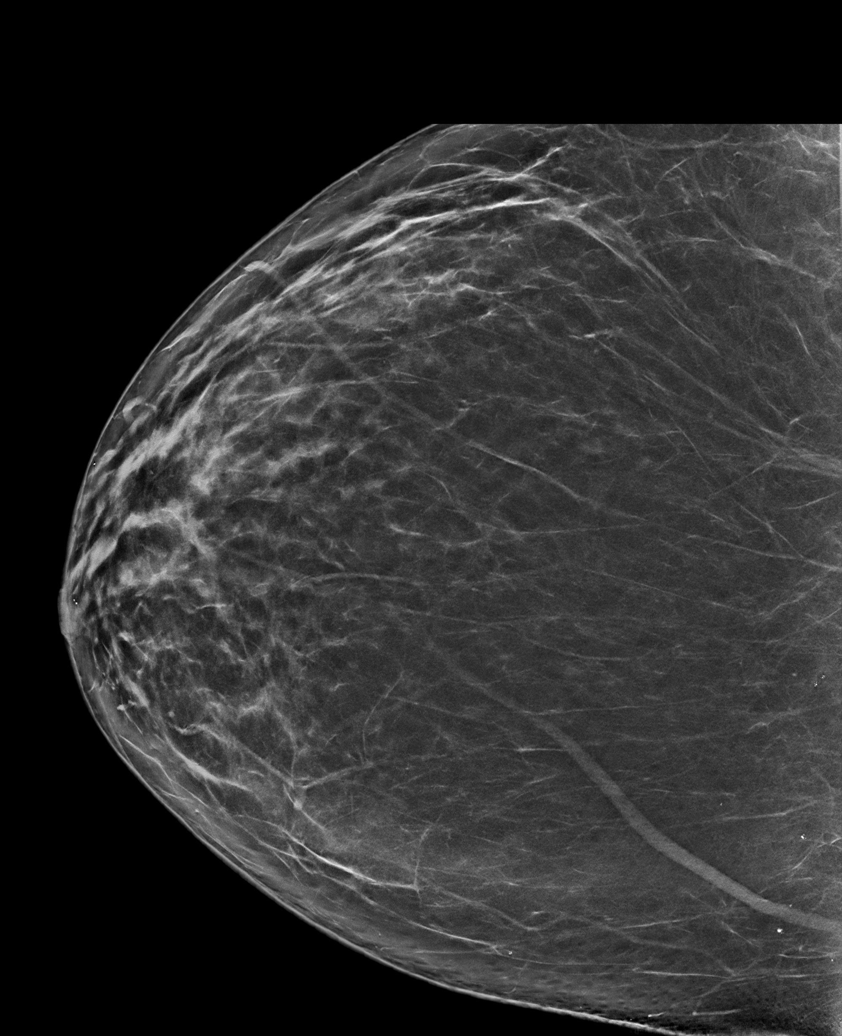

[R CC synth-2D (2 of 2)]
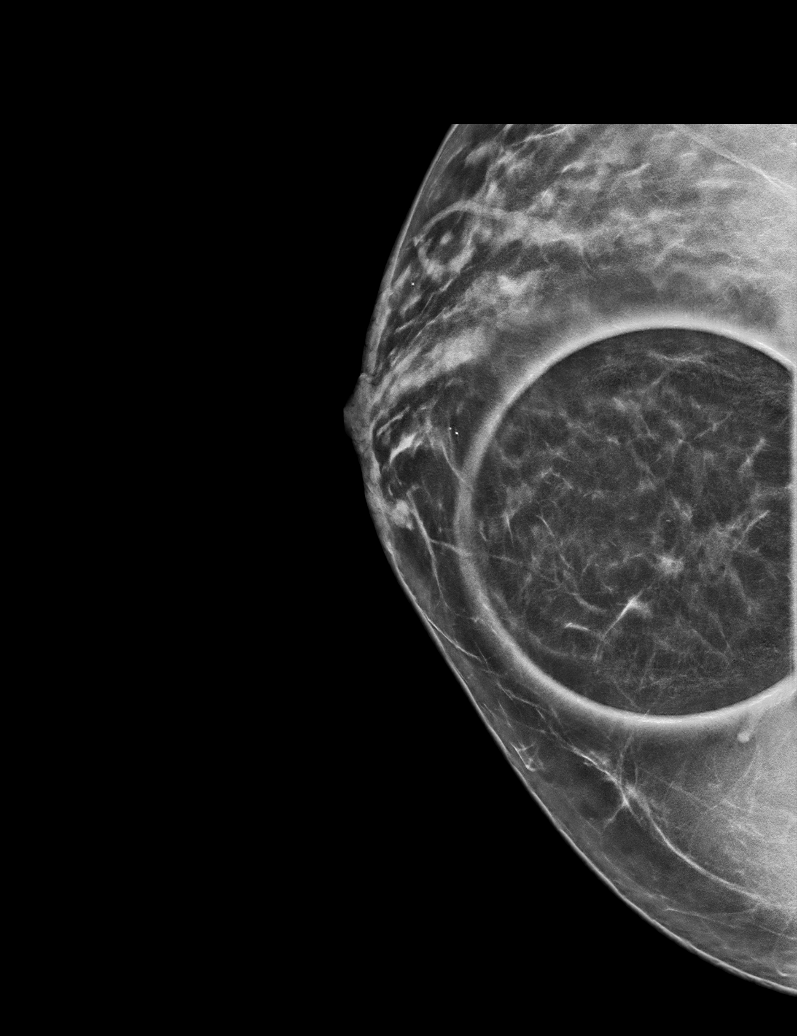

[R ML tomo · tomo slice 53/106.0]
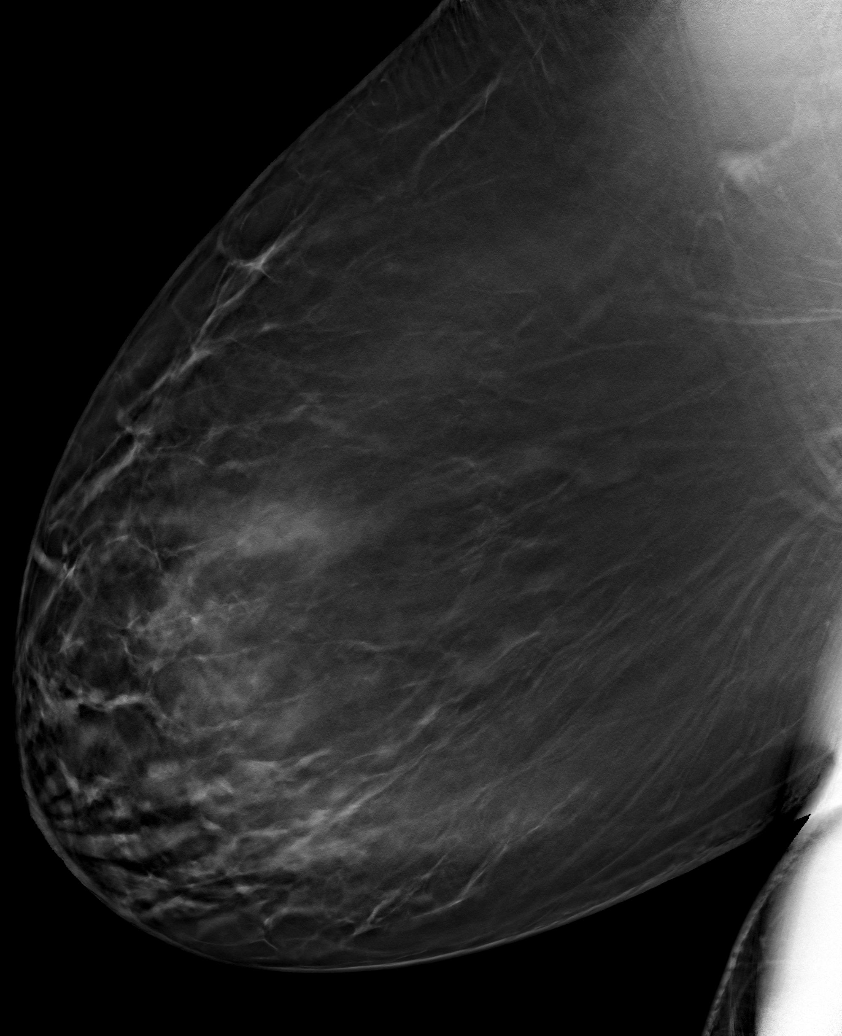

[R CC tomo (1 of 2) · tomo slice 46/91.0]
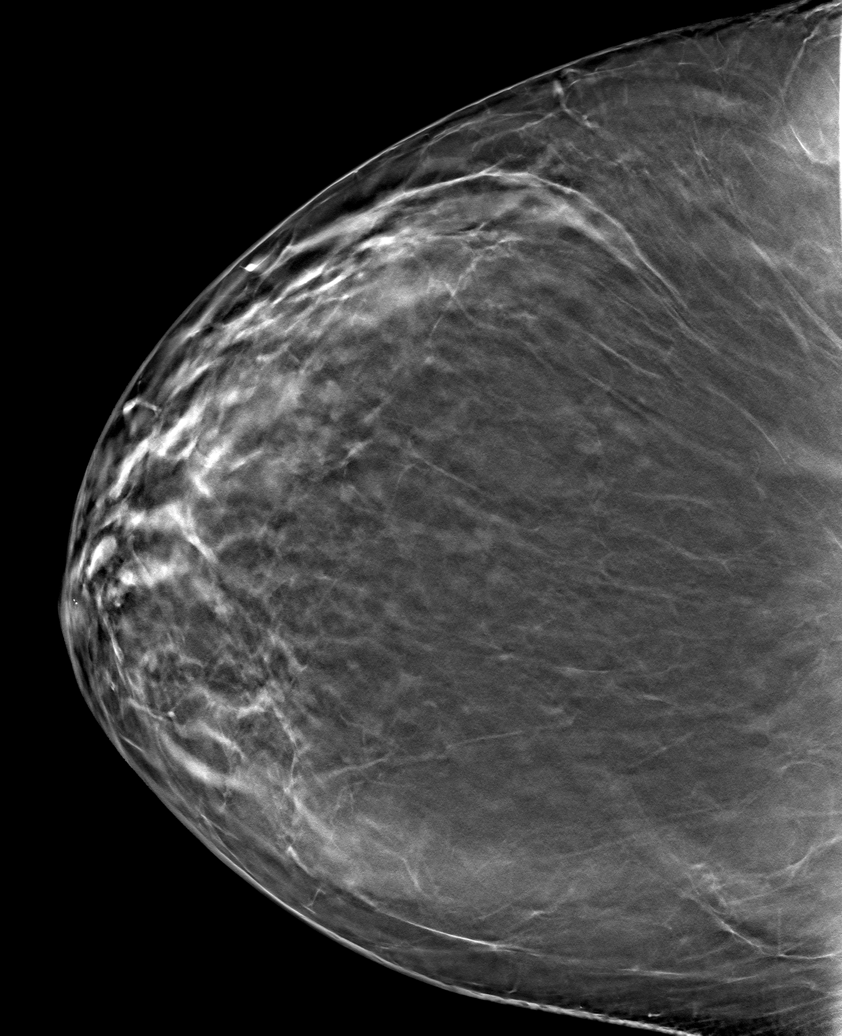

[R CC tomo (2 of 2) · tomo slice 35/69.0]
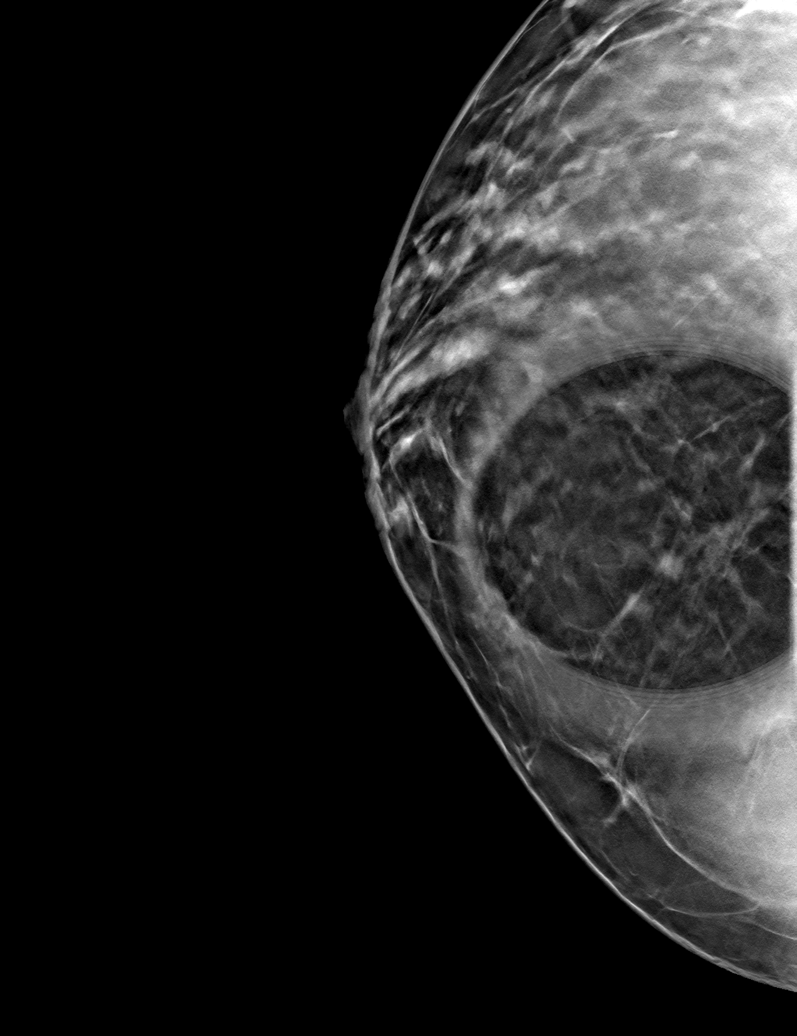

[6 of 18 positions shown; findings below may reference images not displayed]

ACR Breast Density Category b: There are scattered areas of
fibroglandular density.
FINDINGS: Asymmetry within the anteromedial right breast predominately
resolved with additional imaging suggestive of dense fibroglandular
tissue.

Mammographic images were processed with CAD.

Targeted ultrasound is performed, showing normal tissue within the
medial right breast. No suspicious abnormality identified.
IMPRESSION: No mammographic evidence for malignancy.

RECOMMENDATION:
Screening mammogram in one year.(Code:[id number])

I have discussed the findings and recommendations with the patient.
If applicable, a reminder letter will be sent to the patient
regarding the next appointment.

BI-RADS CATEGORY  1: Negative.

## 2022-06-06 NOTE — Progress Notes (Addendum)
Name: Debra Mathews  MRN/ DOB: [id number], [birth year]   Age/ Sex: 57 y.o., female    PCP: Saintclair Halsted, FNP   Reason for Endocrinology Evaluation: Type 2 Diabetes Mellitus     Date of Initial Endocrinology Visit: 06/07/2022     PATIENT IDENTIFIER: Debra Mathews is a 57 y.o. female with a past medical history of DM, HTN, OSA on CPAP ,anemia . The patient presented for initial endocrinology clinic visit on 06/07/2022 for consultative assistance with her diabetes management.    HPI: Debra Mathews was    Diagnosed with DM 11/2021, when she presented to urgent care with symptomatic hypoglycemia was 1 noted with a BG 395 mg/dL and A1c of 9.3% Prior Medications tried/Intolerance: On her initial diagnosis she was started on metformin and glipizide but she developed explosive diarrhea to metformin , she was subsequently started on insulin but due to hypoglycemia she stopped it around 11/ 2023 Currently checking blood sugars multiple  x daily through Freestyle libre Hypoglycemia episodes : not since being off insulin       Hemoglobin A1c has ranged from 6.1% in 2023, peaking at 9.3% in 2023. Patient has required hospitalization within the last 1 year from hyper or hypoglycemia: 11/2021  In terms of diet, the patient eats 2 meals a day, snacks at times. Avoids sugar sweetened beverages    Patient is concerned about adrenal insufficiency, she has rheumatoid arthritis and takes glucocorticoids intermittently for flareups.   She continues to complain of extreme fatigue, dizziness, hyperhidrosis with minimal activity such as showering as well as drop in BG's with minimum activity, she has been sleeping excessively during the day as well, of note the patient has OSA and is on CPAP    HOME DIABETES REGIMEN: N/a  Statin: no ACE-I/ARB: yes   CONTINUOUS GLUCOSE MONITORING RECORD INTERPRETATION    Dates of Recording: 1/17-1/30/2024  Sensor description:freestyle libre   Results statistics:    CGM use % of time 67  Average and SD 103/13.2  Time in range     99   %  % Time Above 180 0  % Time above 250 0  % Time Below target 1   Glycemic patterns summary: Optimal BG's through the day and night   Hyperglycemic episodes  n/a  Hypoglycemic episodes occurred n/a  Overnight periods: optimal    DIABETIC COMPLICATIONS: Microvascular complications:  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  Denies: CKD  Last eye exam: Completed 2023  Macrovascular complications:   Denies: CAD, PVD, CVA   PAST HISTORY: Past Medical History:  Past Medical History:  Diagnosis Date   'light-for-dates' infant with signs of fetal malnutrition 11/01/2016   Acne    adult   Anxiety    Arthritis 07/23/2019   Chest pain 07/23/2019   Elevated BP    no dx of HTN   Fibroids    History of prolonged Q-T interval on ECG 07/23/2019   HTN (hypertension), benign 10/18/2011   Iron deficiency anemia 10/18/2011   Hb 9.1 MCV 65.8 08/24/11 hx menorrhagia/fibroids/nl GYN exam   Menorrhagia with regular cycle 10/18/2011   Obesity    Obesity, Class III, BMI 40-49.9 (morbid obesity) ([facility]) 07/12/2016   Shortness of breath 07/23/2019   Sinus tachycardia    Past Surgical History:  Past Surgical History:  Procedure Laterality Date   APPENDECTOMY     MYOMECTOMY     OVARIAN CYST REMOVAL     ROOT CANAL  2018  Social History:  reports that she has never smoked. She has never used smokeless tobacco. She reports that she does not drink alcohol and does not use drugs. Family History:  Family History  Problem Relation Age of Onset   Osteoarthritis Mother    COPD Mother    Diabetes Mother    Hypertension Mother    Cancer - Lung Mother    Cancer - Colon Mother    Cancer Mother        breast    Osteoarthritis Father    Diabetes Father    Diabetes Brother    Hypertension Brother    Stroke Brother    Hypertension Brother    Raynaud syndrome Daughter      HOME MEDICATIONS: Allergies as of  06/07/2022       Reactions   Oseltamivir Diarrhea, Nausea And Vomiting        Medication List        Accurate as of June 07, 2022  2:14 PM. If you have any questions, ask your nurse or doctor.          amLODipine 5 MG tablet Commonly known as: NORVASC TAKE 2 TABLETS BY MOUTH DAILY.   blood glucose meter kit and supplies Kit Dispense based on patient and insurance preference. Use up to four times daily as directed.   glipiZIDE 5 MG tablet Commonly known as: GLUCOTROL Take 0.5 tablets (2.5 mg total) by mouth daily before breakfast.   ipratropium 0.06 % nasal spray Commonly known as: ATROVENT as needed.   levocetirizine 5 MG tablet Commonly known as: XYZAL Take 5 mg by mouth every evening.   levocetirizine 5 MG tablet Commonly known as: XYZAL TAKE 1 TABLET BY MOUTH EVERY EVENING.   metFORMIN 1000 MG tablet Commonly known as: GLUCOPHAGE Take 1 tablet (1,000 mg total) by mouth 2 (two) times daily.   nitroGLYCERIN 0.4 MG SL tablet Commonly known as: NITROSTAT Place 1 tablet (0.4 mg total) under the tongue every 5 (five) minutes as needed for chest pain.   sulfaSALAzine 500 MG tablet Commonly known as: AZULFIDINE Take 1,000 mg by mouth 2 (two) times daily.   tizanidine 2 MG capsule Commonly known as: ZANAFLEX Take 2 mg by mouth 3 (three) times daily.   valsartan 160 MG tablet Commonly known as: DIOVAN Take 160 mg by mouth daily.   valsartan 160 MG tablet Commonly known as: DIOVAN TAKE 1 TABLET BY MOUTH ONCE DAILY   venlafaxine XR 150 MG 24 hr capsule Commonly known as: EFFEXOR-XR Take 1 capsule (150 mg total) by mouth daily.         ALLERGIES: Allergies  Allergen Reactions   Oseltamivir Diarrhea and Nausea And Vomiting     REVIEW OF SYSTEMS: A comprehensive ROS was conducted with the patient and is negative except as per HPI and below:  Review of Systems  Eyes:  Negative for blurred vision.  Gastrointestinal:  Negative for diarrhea and  nausea.  Neurological:  Positive for tingling.      OBJECTIVE:   VITAL SIGNS: BP 126/82 (BP Location: Right Arm, Patient Position: Sitting, Cuff Size: Large)   Pulse (!) 110   Ht '5\' 6"'$  (1.676 m)   Wt (!) 309 lb (140.2 kg)   SpO2 99%   BMI 49.87 kg/m      Body surface area is 2.56 meters squared.  PHYSICAL EXAM:  General: Pt appears well and is in NAD  Neck: General: Supple without adenopathy or carotid bruits. Thyroid: Thyroid size normal.  No goiter or nodules appreciated.   Lungs: Clear with good BS bilat with no rales, rhonchi, or wheezes  Heart: RRR   Abdomen:  soft, nontender  Extremities:  Lower extremities - No pretibial edema.   Neuro: MS is good with appropriate affect, pt is alert and Ox3    DM foot exam: 06/07/2022  The skin of the feet is intact without sores or ulcerations. The pedal pulses are 2+ on right and 2+ on left. The sensation is intact to a screening 5.07, 10 gram monofilament bilaterally   DATA REVIEWED:  05/04/2022 A1c 6.1% BUN 11 Cr 1.140 GFR 57 HDL 47 LDL 165 Ma/cr ratio 5.9 K 4.3 Tg 99  TSH 3.070  ASSESSMENT / PLAN / RECOMMENDATIONS:   1) Type 2 Diabetes Mellitus, optimally controlled, With out complications - Most recent A1c of 6.1 %. Goal A1c < 7.0 %.    -Patient attributes hyperglycemia due to glucocorticoid use for rheumatoid arthritis, she has not been on glucocorticoid since the summer 2023 -She is intolerant to metformin -She has been off insulin for approximately 3 months, but she is interested in weight loss medications, we discussed GLP-1 agonist.  She is interested in [place] but I did explain to the patient that Darcel Bayley is superior and I would prefer we try that one first, if it is not covered we will proceed with Ozempic -We have opted to consider initiation of treatment once her cosyntropin test has been done  MEDICATIONS: Will start Mounjaro 2.5 mg weekly after cosyntropin stimulation test  EDUCATION /  INSTRUCTIONS: BG monitoring instructions: Patient is instructed to check her blood sugars 3 times a day. Call [facility] if: BG persistently < 70  I reviewed the Rule of 15 for the treatment of hypoglycemia in detail with the patient. Literature supplied.   2) Diabetic complications:  Eye: Does not have known diabetic retinopathy.  Neuro/ Feet: Does not have known diabetic peripheral neuropathy. Renal: Patient does not have known baseline CKD. She is  on an ACEI/ARB at present.  3) Fatigue:  -Patient describes extreme fatigue and lightheadedness, concern regarding adrenal insufficiency given history of chronic intermittent use of glucocorticoids -Patient will return to the office for nurse visit for cosyntropin stimulation test    Follow-up 09/15/2022  Signed electronically by: Mack Guise, MD  [facility]  [address] [place], [place] [address] Phone: [phone] FAX: [phone]   CC: Saintclair Halsted, FNP [place] [address] Phone: [phone]  Fax: [phone]    Return to Endocrinology clinic as below: Future Appointments  Date Time Provider [place]  06/16/2022  8:00 AM [facility] NURSE [facility] None  06/16/2022  8:00 AM [facility] END[facility]NEUR[facility] None  09/15/2022 11:50 AM Skylar Flynt, Melanie Crazier, MD [facility] None

## 2022-06-07 ENCOUNTER — Encounter: Payer: Self-pay | Admitting: Internal Medicine

## 2022-06-07 ENCOUNTER — Ambulatory Visit (INDEPENDENT_AMBULATORY_CARE_PROVIDER_SITE_OTHER): Payer: Commercial Managed Care - HMO | Admitting: Internal Medicine

## 2022-06-07 VITALS — BP 126/82 | HR 110 | Ht 66.0 in | Wt 309.0 lb

## 2022-06-07 DIAGNOSIS — R5383 Other fatigue: Secondary | ICD-10-CM

## 2022-06-07 DIAGNOSIS — E119 Type 2 diabetes mellitus without complications: Secondary | ICD-10-CM | POA: Insufficient documentation

## 2022-06-09 DIAGNOSIS — Z419 Encounter for procedure for purposes other than remedying health state, unspecified: Secondary | ICD-10-CM | POA: Diagnosis not present

## 2022-06-14 ENCOUNTER — Other Ambulatory Visit: Payer: Self-pay | Admitting: Family Medicine

## 2022-06-14 DIAGNOSIS — Z1231 Encounter for screening mammogram for malignant neoplasm of breast: Secondary | ICD-10-CM

## 2022-06-16 ENCOUNTER — Ambulatory Visit (INDEPENDENT_AMBULATORY_CARE_PROVIDER_SITE_OTHER): Payer: Commercial Managed Care - HMO

## 2022-06-16 ENCOUNTER — Other Ambulatory Visit (INDEPENDENT_AMBULATORY_CARE_PROVIDER_SITE_OTHER): Payer: Commercial Managed Care - HMO

## 2022-06-16 ENCOUNTER — Other Ambulatory Visit (HOSPITAL_COMMUNITY): Payer: Self-pay

## 2022-06-16 ENCOUNTER — Telehealth: Payer: Self-pay | Admitting: Internal Medicine

## 2022-06-16 VITALS — Ht 66.0 in | Wt 309.0 lb

## 2022-06-16 DIAGNOSIS — E119 Type 2 diabetes mellitus without complications: Secondary | ICD-10-CM

## 2022-06-16 DIAGNOSIS — R5383 Other fatigue: Secondary | ICD-10-CM | POA: Diagnosis not present

## 2022-06-16 LAB — CORTISOL
Cortisol, Plasma: 14.3 ug/dL
Cortisol, Plasma: 17.1 ug/dL
Cortisol, Plasma: 17.6 ug/dL

## 2022-06-16 MED ORDER — HYDROCORTISONE 10 MG PO TABS
10.0000 mg | ORAL_TABLET | ORAL | 3 refills | Status: DC
  Filled 2022-06-16: qty 300, 100d supply, fill #0

## 2022-06-16 MED ORDER — COSYNTROPIN 0.25 MG IJ SOLR
0.2500 mg | Freq: Once | INTRAMUSCULAR | Status: AC
  Administered 2022-06-16: 0.25 mg via INTRAVENOUS

## 2022-06-16 MED ORDER — HYDROCORTISONE 10 MG PO TABS: 10.0000 mg | ORAL_TABLET | ORAL | 3 refills | Status: DC

## 2022-06-16 NOTE — Telephone Encounter (Signed)
Pt wanted to know if there was some literature she could get regarding Adrenal Insufficiency and what all it means? Also where might the best place be to get a medical alert bracelet from.

## 2022-06-16 NOTE — Telephone Encounter (Signed)
Can you please let Dr. Follow-up know that unfortunately she did not pass the cosyntropin stimulation test and she does have adrenal insufficiency   She will need to start hydrocortisone 10 mg, 2 tablets with breakfast and 1 tablet between 2-4 PM every day    She will need to obtain a medical alert bracelet that says adrenal insufficiency    I will hold off on starting Mounjaro at this time until I recheck her on next visit, I want her to be on hydrocortisone first

## 2022-06-16 NOTE — Progress Notes (Signed)
After obtaining consent, and per orders of Dr. Kelton Pillar , injection of Cosyntropin '1mg'$   given by Juno Alers L Jamine Highfill in right deltoid.

## 2022-06-17 ENCOUNTER — Other Ambulatory Visit (HOSPITAL_COMMUNITY): Payer: Self-pay

## 2022-07-08 DIAGNOSIS — Z419 Encounter for procedure for purposes other than remedying health state, unspecified: Secondary | ICD-10-CM | POA: Diagnosis not present

## 2022-07-28 ENCOUNTER — Ambulatory Visit
Admission: RE | Admit: 2022-07-28 | Discharge: 2022-07-28 | Disposition: A | Discharge status: Home / Self Care | Payer: Commercial Managed Care - HMO | Source: Ambulatory Visit | Attending: Family Medicine | Admitting: Family Medicine

## 2022-07-28 DIAGNOSIS — Z1231 Encounter for screening mammogram for malignant neoplasm of breast: Secondary | ICD-10-CM

## 2022-08-08 DIAGNOSIS — Z419 Encounter for procedure for purposes other than remedying health state, unspecified: Secondary | ICD-10-CM | POA: Diagnosis not present

## 2022-08-12 ENCOUNTER — Encounter: Payer: Self-pay | Admitting: Family Medicine

## 2022-09-07 DIAGNOSIS — Z419 Encounter for procedure for purposes other than remedying health state, unspecified: Secondary | ICD-10-CM | POA: Diagnosis not present

## 2022-09-15 ENCOUNTER — Other Ambulatory Visit: Payer: Self-pay | Admitting: Internal Medicine

## 2022-09-15 ENCOUNTER — Other Ambulatory Visit (HOSPITAL_BASED_OUTPATIENT_CLINIC_OR_DEPARTMENT_OTHER): Payer: Self-pay

## 2022-09-15 ENCOUNTER — Other Ambulatory Visit (HOSPITAL_COMMUNITY): Payer: Self-pay

## 2022-09-15 ENCOUNTER — Encounter: Payer: Self-pay | Admitting: Internal Medicine

## 2022-09-15 ENCOUNTER — Ambulatory Visit (INDEPENDENT_AMBULATORY_CARE_PROVIDER_SITE_OTHER): Payer: Commercial Managed Care - HMO | Admitting: Internal Medicine

## 2022-09-15 VITALS — BP 128/80 | HR 105 | Ht 66.0 in

## 2022-09-15 DIAGNOSIS — E1165 Type 2 diabetes mellitus with hyperglycemia: Secondary | ICD-10-CM | POA: Insufficient documentation

## 2022-09-15 DIAGNOSIS — J0191 Acute recurrent sinusitis, unspecified: Secondary | ICD-10-CM | POA: Diagnosis not present

## 2022-09-15 DIAGNOSIS — Z794 Long term (current) use of insulin: Secondary | ICD-10-CM | POA: Diagnosis not present

## 2022-09-15 DIAGNOSIS — E2740 Unspecified adrenocortical insufficiency: Secondary | ICD-10-CM | POA: Diagnosis not present

## 2022-09-15 LAB — COMPREHENSIVE METABOLIC PANEL
ALT: 15 U/L (ref 0–35)
AST: 24 U/L (ref 0–37)
Albumin: 4.1 g/dL (ref 3.5–5.2)
Alkaline Phosphatase: 93 U/L (ref 39–117)
BUN: 10 mg/dL (ref 6–23)
CO2: 23 mEq/L (ref 19–32)
Calcium: 10.6 mg/dL — ABNORMAL HIGH (ref 8.4–10.5)
Chloride: 104 mEq/L (ref 96–112)
Creatinine, Ser: 1.12 mg/dL (ref 0.40–1.20)
GFR: 55.06 mL/min — ABNORMAL LOW (ref >60.00)
Glucose, Bld: 153 mg/dL — ABNORMAL HIGH (ref 70–99)
Potassium: 4.2 mEq/L (ref 3.5–5.1)
Sodium: 141 mEq/L (ref 135–145)
Total Bilirubin: 0.3 mg/dL (ref 0.2–1.2)
Total Protein: 7.6 g/dL (ref 6.0–8.3)

## 2022-09-15 LAB — LIPID PANEL
Cholesterol: 213 mg/dL — ABNORMAL HIGH (ref 0–200)
HDL: 44.3 mg/dL (ref >39.00)
LDL Cholesterol: 149 mg/dL — ABNORMAL HIGH (ref 0–99)
NonHDL: 168.52
Total CHOL/HDL Ratio: 5
Triglycerides: 97 mg/dL (ref 0.0–149.0)
VLDL: 19.4 mg/dL (ref 0.0–40.0)

## 2022-09-15 LAB — POCT GLYCOSYLATED HEMOGLOBIN (HGB A1C): Hemoglobin A1C: 7.6 % — AB (ref 4.0–5.6)

## 2022-09-15 LAB — T4, FREE: Free T4: 0.93 ng/dL (ref 0.60–1.60)

## 2022-09-15 LAB — CORTISOL: Cortisol, Plasma: 7.8 ug/dL

## 2022-09-15 LAB — TSH: TSH: 1.98 u[IU]/mL (ref 0.35–5.50)

## 2022-09-15 MED ORDER — HYDROCORTISONE 10 MG PO TABS: 10.0000 mg | ORAL_TABLET | ORAL | 3 refills | Status: DC

## 2022-09-15 MED ORDER — TIRZEPATIDE 2.5 MG/0.5ML ~~LOC~~ SOAJ: 2.5000 mg | SUBCUTANEOUS | 3 refills | Status: DC

## 2022-09-15 MED ORDER — TRESIBA FLEXTOUCH 100 UNIT/ML ~~LOC~~ SOPN: 30.0000 [IU] | PEN_INJECTOR | Freq: Every day | SUBCUTANEOUS | 3 refills | Status: DC

## 2022-09-15 MED ORDER — INSULIN PEN NEEDLE 31G X 8 MM MISC: 1.0000 | Freq: Four times a day (QID) | 3 refills | Status: DC

## 2022-09-15 MED ORDER — HUMALOG KWIKPEN 100 UNIT/ML ~~LOC~~ SOPN: PEN_INJECTOR | SUBCUTANEOUS | 3 refills | Status: DC

## 2022-09-15 MED ORDER — DOXYCYCLINE HYCLATE 100 MG PO TABS
100.0000 mg | ORAL_TABLET | Freq: Two times a day (BID) | ORAL | 0 refills | Status: DC
  Filled 2022-09-15 – 2022-09-27 (×2): qty 20, 10d supply, fill #0

## 2022-09-15 MED ORDER — FREESTYLE LIBRE 3 SENSOR MISC: 1.0000 | 3 refills | Status: DC

## 2022-09-15 MED ORDER — TIRZEPATIDE 2.5 MG/0.5ML ~~LOC~~ SOAJ
2.5000 mg | SUBCUTANEOUS | 3 refills | Status: DC
  Filled 2022-09-15: qty 2, 28d supply, fill #0

## 2022-09-15 NOTE — Progress Notes (Signed)
Name: Debra Mathews  MRN/ DOB: [id number], 01-15-1966   Age/ Sex: 57 y.o., female    PCP: Camie Patience, FNP   Reason for Endocrinology Evaluation: Type 2 Diabetes Mellitus     Date of Initial Endocrinology Visit: 06/07/2022    PATIENT IDENTIFIER: Debra Mathews is a 57 y.o. female with a past medical history of DM, HTN, OSA on CPAP ,anemia . The patient presented for initial endocrinology clinic visit on 06/07/2022 for consultative assistance with her diabetes management.    HPI: Debra Mathews was    Diagnosed with DM 11/2021, when she presented to urgent care with symptomatic hyperglycemia was noted with a BG 395 mg/dL and Y4I of 3.4% Prior Medications tried/Intolerance: On her initial diagnosis she was started on metformin and glipizide but she developed explosive diarrhea to metformin , she was subsequently started on insulin but due to hypoglycemia she stopped it around 11/ 2023      Hemoglobin A1c has ranged from 6.1% in 2023, peaking at 9.3% in 2023.  On her initial visit to our clinic her A1c was 6.1%, she was not on any glycemic agents  Unfortunately, by her return 09/2022 she had already restarted basal/prandial insulin due to hyperglycemia in the 300s mg/DL  ADRENAL HISTORY: Patient had concerns related to adrenal insufficiency, she has rheumatoid arthritis and takes glucocorticoids intermittently for flareups.   She was having extreme fatigue, dizziness as well as hyperhidrosis with minimal activity as well as hypoglycemia with no glycemic agents  Cosyntropin stimulation test was abnormal at 17.6 ug/dL at 60 minutes She was started on hydrocortisone by 06/2022  Pt with OSA  on CPAP   SUBJECTIVE:   During the last visit (06/07/2022): A1c 6.1%    Today (09/15/22): Debra Mathews is here for a follow up on Diabetes management and adrenal insufficiency.  She  checks her blood sugars multiple  times daily through CGM but has not been wearing the sensor   She has noted  symptomatic hyperglycemia with polyuria and nocturia , she had not been using the sensors prior to that as her insurance was not covering it, after she became symptomatic she applied freestyle libre sensors and BG was 300 mg/DL.  Patient restarted Evaristo Bury as well as Humalog  She was treated approximately 6 weeks ago for sinus infection through her PCP with Augmentin, her symptoms improved but never resolved, today she is complaining of worsening sinus symptoms   Denies nausea, or vomiting  Denies constipation or diarrhea  Denies pancreatitis   HOME ENDOCRINE REGIMEN: HC 10 mg 2 tabs QAM and 1 tab QPM Tresiba 30 units daily  Humalog 10-14 units TIDQAC    Statin: no ACE-I/ARB: yes   CONTINUOUS GLUCOSE MONITORING RECORD INTERPRETATION    Dates of Recording: 4/20-09/09/2022  Sensor description: [employer] 2  Results statistics:   CGM use % of time 95%  Average and SD 179/27.9  Time in range     54   %  % Time Above 180 35  % Time above 250 11  % Time Below target 0   Glycemic patterns summary: BG's trend down overnight and increased throughout the day  Hyperglycemic episodes postprandial  Hypoglycemic episodes occurred N/A  Overnight periods: Trends down    DIABETIC COMPLICATIONS: Microvascular complications:  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  Denies: CKD  Last eye exam: Completed 2023  Macrovascular complications:   Denies: CAD, PVD, CVA   PAST HISTORY: Past Medical History:  Past Medical History:  Diagnosis Date   'Light-for-dates' infant with signs of fetal malnutrition 11/01/2016   Acne    adult   Anxiety    Arthritis 07/23/2019   Chest pain 07/23/2019   Elevated BP    no dx of HTN   Fibroids    History of prolonged Q-T interval on ECG 07/23/2019   HTN (hypertension), benign 10/18/2011   Iron deficiency anemia 10/18/2011   Hb 9.1 MCV 65.8 08/24/11 hx menorrhagia/fibroids/nl GYN exam   Menorrhagia with regular cycle 10/18/2011    Obesity    Obesity, Class III, BMI 40-49.9 (morbid obesity) ([facility]) 07/12/2016   Shortness of breath 07/23/2019   Sinus tachycardia    Past Surgical History:  Past Surgical History:  Procedure Laterality Date   APPENDECTOMY     MYOMECTOMY     OVARIAN CYST REMOVAL     ROOT CANAL  2018    Social History:  reports that she has never smoked. She has never used smokeless tobacco. She reports that she does not drink alcohol and does not use drugs. Family History:  Family History  Problem Relation Age of Onset   Osteoarthritis Mother    COPD Mother    Diabetes Mother    Hypertension Mother    Cancer - Lung Mother    Cancer - Colon Mother    Cancer Mother        breast    Osteoarthritis Father    Diabetes Father    Diabetes Brother    Hypertension Brother    Stroke Brother    Hypertension Brother    Raynaud syndrome Daughter      HOME MEDICATIONS: Allergies as of 09/15/2022       Reactions   Metformin And Related Diarrhea   Oseltamivir Diarrhea, Nausea And Vomiting        Medication List        Accurate as of Sep 15, 2022  1:36 PM. If you have any questions, ask your nurse or doctor.          amLODipine 5 MG tablet Commonly known as: NORVASC TAKE 2 TABLETS BY MOUTH DAILY.   blood glucose meter kit and supplies Kit Dispense based on patient and insurance preference. Use up to four times daily as directed.   doxycycline 100 MG tablet Commonly known as: VIBRA-TABS Take 1 tablet (100 mg total) by mouth 2 (two) times daily. Started by: Scarlette Shorts, Debra Mathews   escitalopram 10 MG tablet Commonly known as: LEXAPRO Take 10 mg by mouth daily.   FreeStyle Libre 3 Sensor Misc 1 Device by Does not apply route every 14 (fourteen) days. Started by: Scarlette Shorts, Debra Mathews   HumaLOG KwikPen 100 UNIT/ML KwikPen Generic drug: insulin lispro Max daily 60 units What changed:  how much to take how to take this when to take this additional instructions Changed by:  Scarlette Shorts, Debra Mathews   hydrocortisone 10 MG tablet Commonly known as: Cortef Take 2 tablets with breakfast, and 1 tablet between 2 and 4 PM   Insulin Pen Needle 31G X 8 MM Misc 1 Device by Does not apply route in the morning, at noon, in the evening, and at bedtime. Started by: Scarlette Shorts, Debra Mathews   ipratropium 0.06 % nasal spray Commonly known as: ATROVENT as needed.   levocetirizine 5 MG tablet Commonly known as: XYZAL Take 5 mg by mouth every evening.   levocetirizine 5 MG tablet Commonly known as: XYZAL TAKE 1 TABLET BY MOUTH  EVERY EVENING.   nitroGLYCERIN 0.4 MG SL tablet Commonly known as: NITROSTAT Place 1 tablet (0.4 mg total) under the tongue every 5 (five) minutes as needed for chest pain.   rosuvastatin 10 MG tablet Commonly known as: CRESTOR Take 10 mg by mouth at bedtime.   sulfaSALAzine 500 MG tablet Commonly known as: AZULFIDINE Take 1,000 mg by mouth 2 (two) times daily.   tirzepatide 2.5 MG/0.5ML Pen Commonly known as: MOUNJARO Inject 2.5 mg into the skin once a week. Started by: Scarlette Shorts, Debra Mathews   tizanidine 2 MG capsule Commonly known as: ZANAFLEX Take 2 mg by mouth 3 (three) times daily.   Evaristo Bury FlexTouch 100 UNIT/ML FlexTouch Pen Generic drug: insulin degludec Inject 30 Units into the skin daily. What changed: how much to take Changed by: Scarlette Shorts, Debra Mathews   valsartan 160 MG tablet Commonly known as: DIOVAN Take 160 mg by mouth daily.   valsartan 160 MG tablet Commonly known as: DIOVAN TAKE 1 TABLET BY MOUTH ONCE DAILY   venlafaxine XR 150 MG 24 hr capsule Commonly known as: EFFEXOR-XR Take 1 capsule (150 mg total) by mouth daily.         ALLERGIES: Allergies  Allergen Reactions   Metformin And Related Diarrhea   Oseltamivir Diarrhea and Nausea And Vomiting     REVIEW OF SYSTEMS: A comprehensive ROS was conducted with the patient and is negative except as per HPI     OBJECTIVE:   VITAL  SIGNS: BP 128/80 (BP Location: Left Arm, Patient Position: Sitting, Cuff Size: Large)   Pulse (!) 105   Ht 5\' 6"  (1.676 m)   SpO2 96%   BMI 49.87 kg/m      Body surface area is 2.56 meters squared.  PHYSICAL EXAM:  General: Pt appears well and is in NAD  Lungs: Clear with good BS bilat with no rales, rhonchi, or wheezes  Heart: RRR   Abdomen:  soft, nontender  Extremities:  Lower extremities - No pretibial edema.   Neuro: MS is good with appropriate affect, pt is alert and Ox3    DM foot exam: 06/07/2022  The skin of the feet is intact without sores or ulcerations. The pedal pulses are 2+ on right and 2+ on left. The sensation is intact to a screening 5.07, 10 gram monofilament bilaterally   DATA REVIEWED:  05/04/2022 A1c 6.1% BUN 11 Cr 1.140 GFR 57 HDL 47 LDL 165 Ma/cr ratio 5.9 K 4.3 Tg 99  TSH 3.070  ASSESSMENT / PLAN / RECOMMENDATIONS:   1) Type 2 Diabetes Mellitus, Sub-optimally controlled, Without complications - Most recent A1c of 7.6 %. Goal A1c < 7.0 %.    -Patient has been noted with hyperglycemia and she is back on basal/prandial insulin -She denies any changes in her diet, but the patient has been noted with late night snacking at times -She is intolerant to metformin -She will continue with [place] and Humalog, she will be provided with correction scale to be used before each meal -We attempted to prescribe Mounjaro, but this is not covered by her insurance, Ozempic was sent instead -Evaristo Bury is not on the formulary, will switch to [employer]  MEDICATIONS: Switch Tresiba to [employer] 30 units daily Continue Humalog 10-14 units 3 times daily before every meal CF: Humalog (BG -130/30) 3 times daily before every meal Ozempic 0.25 mg weekly  EDUCATION / INSTRUCTIONS: BG monitoring instructions: Patient is instructed to check her blood sugars 3 times a day. Call [facility] Endocrinology clinic  if: BG persistently < 70  I reviewed the Rule of 15 for the  treatment of hypoglycemia in detail with the patient. Literature supplied.   2) Diabetic complications:  Eye: Does not have known diabetic retinopathy.  Neuro/ Feet: Does not have known diabetic peripheral neuropathy. Renal: Patient does not have known baseline CKD. She is  on an ACEI/ARB at present.  3) Adrenal insufficiency:  -Patient had an abnormal cosyntropin stimulation test 06/2022 and was started on hydrocortisone -Discussed sick day will by doubling up with severe illness for 2-3 days during the acute phase and returning to regular dose after that  Medication Continue hydrocortisone 10 mg, 2 tabs with breakfast and 1 tab between 2-4 PM   4)Dyslipidemia:  -LDL above goal -She is on rosuvastatin 10 mg daily, will continue to monitor, if this remains elevated will increase in the future  Follow-up 4 months   Signed electronically by: Lyndle Herrlich, Debra Mathews  [facility] End[facility]  [facility] [address] [place] [place], [place] [address] Phone: [phone] FAX: [phone]   CC: Camie Patience, FNP [address] [place] [address] Phone: [phone]  Fax: [phone]    Return to Endocrinology clinic as below: Future Appointments  Date Time Provider Department Center  01/23/2023 12:10 PM Debra Mathews, Debra Dolores, Debra Mathews [facility] None

## 2022-09-15 NOTE — Patient Instructions (Signed)
Start Mounjaro 2.5 mg weekly  Continue Tresiba 30 units daily  Continue Humalog 10-14 units with each meal  Humalog correctional insulin: ADD extra units on insulin to your meal-time Humalog dose if your blood sugars are higher than 160. Use the scale below to help guide you:   Blood sugar before meal Number of units to inject  Less than 160 0 unit  161 -  190 1 units  191 -  220 2 units  221 -  250 3 units  251 -  280 4 units  281 -  310 5 units  311 -  340 6 units  341 -  370 7 units  371 -  400 8 units   HOW TO TREAT LOW BLOOD SUGARS (Blood sugar LESS THAN 70 MG/DL) Please follow the RULE OF 15 for the treatment of hypoglycemia treatment (when your (blood sugars are less than 70 mg/dL)   STEP 1: Take 15 grams of carbohydrates when your blood sugar is low, which includes:  3-4 GLUCOSE TABS  OR 3-4 OZ OF JUICE OR REGULAR SODA OR ONE TUBE OF GLUCOSE GEL    STEP 2: RECHECK blood sugar in 15 MINUTES STEP 3: If your blood sugar is still low at the 15 minute recheck --> then, go back to STEP 1 and treat AGAIN with another 15 grams of carbohydrates.

## 2022-09-16 ENCOUNTER — Other Ambulatory Visit: Payer: Self-pay | Admitting: Internal Medicine

## 2022-09-16 ENCOUNTER — Encounter: Payer: Self-pay | Admitting: Internal Medicine

## 2022-09-19 ENCOUNTER — Telehealth: Payer: Self-pay | Admitting: Pharmacy Technician

## 2022-09-19 ENCOUNTER — Other Ambulatory Visit (HOSPITAL_COMMUNITY): Payer: Self-pay

## 2022-09-19 ENCOUNTER — Other Ambulatory Visit: Payer: Self-pay | Admitting: Internal Medicine

## 2022-09-19 MED ORDER — BASAGLAR KWIKPEN 100 UNIT/ML ~~LOC~~ SOPN: 30.0000 [IU] | PEN_INJECTOR | Freq: Every day | SUBCUTANEOUS | 3 refills | Status: DC

## 2022-09-19 NOTE — Telephone Encounter (Signed)
Pharmacy Patient Advocate Encounter  Insurance verification completed.    The patient is insured through [employer] claim for tresiba - non-formulary. Basaglar is preferred. Relayed info to clinical team.

## 2022-09-19 NOTE — Telephone Encounter (Unsigned)
Pharmacy Patient Advocate Encounter   Received notification from RX request msgs that prior authorization for [place] is required/requested.   PA started on 09/19/22 to (ins) [employer] via CoverMyMeds Key or [facility]) confirmation # [id number] Waiting on clinical questions to populate.

## 2022-09-20 ENCOUNTER — Other Ambulatory Visit (HOSPITAL_COMMUNITY): Payer: Self-pay

## 2022-09-20 LAB — ACTH: C206 ACTH: 13 pg/mL (ref 6–50)

## 2022-09-20 NOTE — Telephone Encounter (Signed)
Answered clinical questions and submitted pa today 09/20/22

## 2022-09-20 NOTE — Telephone Encounter (Signed)
PA request submitted. New Encounter created for follow up. For additional info see Prior Auth telephone encounter from 09/19/22.

## 2022-09-20 NOTE — Telephone Encounter (Signed)
Pharmacy Patient Advocate Encounter  Prior Authorization for Byetta has been approved by [place] (ins).    PA # PA Case ID: [id number], [id number] Effective dates: 09/20/22 through 09/20/23  Per test cliam: Copay is $798.10

## 2022-09-27 ENCOUNTER — Other Ambulatory Visit (HOSPITAL_COMMUNITY): Payer: Self-pay

## 2022-09-27 ENCOUNTER — Other Ambulatory Visit: Payer: Self-pay

## 2022-10-08 DIAGNOSIS — Z419 Encounter for procedure for purposes other than remedying health state, unspecified: Secondary | ICD-10-CM | POA: Diagnosis not present

## 2022-11-07 DIAGNOSIS — Z419 Encounter for procedure for purposes other than remedying health state, unspecified: Secondary | ICD-10-CM | POA: Diagnosis not present

## 2022-11-11 ENCOUNTER — Encounter: Payer: Self-pay | Admitting: Internal Medicine

## 2022-11-14 ENCOUNTER — Encounter (HOSPITAL_COMMUNITY): Payer: Self-pay

## 2022-11-14 ENCOUNTER — Telehealth: Payer: Self-pay

## 2022-11-14 ENCOUNTER — Other Ambulatory Visit: Payer: Self-pay | Admitting: Internal Medicine

## 2022-11-14 ENCOUNTER — Other Ambulatory Visit (HOSPITAL_COMMUNITY): Payer: Self-pay

## 2022-11-14 MED ORDER — TIRZEPATIDE 2.5 MG/0.5ML ~~LOC~~ SOAJ
2.5000 mg | SUBCUTANEOUS | 3 refills | Status: DC
  Filled 2022-11-14: qty 2, 28d supply, fill #0

## 2022-11-14 NOTE — Telephone Encounter (Signed)
error 

## 2022-11-15 ENCOUNTER — Telehealth: Payer: Self-pay

## 2022-11-15 ENCOUNTER — Other Ambulatory Visit (HOSPITAL_COMMUNITY): Payer: Self-pay

## 2022-11-15 NOTE — Telephone Encounter (Signed)
Patient states that Greggory Keen should be covered under her plan for Type 2 diabetes.

## 2022-11-15 NOTE — Telephone Encounter (Signed)
Patient Advocate Encounter   Received notification from pt msgs that prior authorization is required for [facility]  Per [employer] PA form:    Insurance prefers Byetta or Science writer. I see a PA was done for Byetta in May, but I am not seeing it on her med list and I am not finding any documentation of intolerance or adverse reaction.   If I am missing the information, please let me know where I can find it.   PA not submitted at this time

## 2022-11-15 NOTE — Telephone Encounter (Signed)
Process PA for [facility]

## 2022-11-15 NOTE — Telephone Encounter (Signed)
Per test claim:   It isn't covered and isn't on formulary. Attempting to do PA for it but if she has not tried the alternatives, the PA will be denied

## 2022-11-16 ENCOUNTER — Other Ambulatory Visit: Payer: Self-pay

## 2022-11-16 DIAGNOSIS — E1165 Type 2 diabetes mellitus with hyperglycemia: Secondary | ICD-10-CM

## 2022-11-16 MED ORDER — HYDROCORTISONE 10 MG PO TABS
10.0000 mg | ORAL_TABLET | ORAL | 3 refills | Status: DC
Start: 2022-11-16 — End: 2023-11-13

## 2022-11-21 ENCOUNTER — Other Ambulatory Visit: Payer: Self-pay

## 2022-11-21 MED ORDER — TRULICITY 0.75 MG/0.5ML ~~LOC~~ SOAJ: 0.7500 mg | SUBCUTANEOUS | 0 refills | Status: DC

## 2022-12-08 DIAGNOSIS — Z419 Encounter for procedure for purposes other than remedying health state, unspecified: Secondary | ICD-10-CM | POA: Diagnosis not present

## 2022-12-30 ENCOUNTER — Other Ambulatory Visit: Payer: Self-pay

## 2022-12-30 ENCOUNTER — Emergency Department (HOSPITAL_BASED_OUTPATIENT_CLINIC_OR_DEPARTMENT_OTHER)
Admission: EM | Admit: 2022-12-30 | Discharge: 2022-12-30 | Disposition: A | Discharge status: Home / Self Care | Payer: Commercial Managed Care - HMO | Attending: Emergency Medicine | Admitting: Emergency Medicine

## 2022-12-30 ENCOUNTER — Emergency Department (HOSPITAL_BASED_OUTPATIENT_CLINIC_OR_DEPARTMENT_OTHER): Payer: Commercial Managed Care - HMO

## 2022-12-30 ENCOUNTER — Encounter (HOSPITAL_BASED_OUTPATIENT_CLINIC_OR_DEPARTMENT_OTHER): Payer: Self-pay

## 2022-12-30 DIAGNOSIS — M549 Dorsalgia, unspecified: Secondary | ICD-10-CM | POA: Insufficient documentation

## 2022-12-30 DIAGNOSIS — Z794 Long term (current) use of insulin: Secondary | ICD-10-CM | POA: Insufficient documentation

## 2022-12-30 DIAGNOSIS — Z79899 Other long term (current) drug therapy: Secondary | ICD-10-CM | POA: Diagnosis not present

## 2022-12-30 DIAGNOSIS — R109 Unspecified abdominal pain: Secondary | ICD-10-CM | POA: Diagnosis not present

## 2022-12-30 DIAGNOSIS — I10 Essential (primary) hypertension: Secondary | ICD-10-CM | POA: Insufficient documentation

## 2022-12-30 DIAGNOSIS — N3001 Acute cystitis with hematuria: Secondary | ICD-10-CM | POA: Diagnosis not present

## 2022-12-30 DIAGNOSIS — E119 Type 2 diabetes mellitus without complications: Secondary | ICD-10-CM | POA: Insufficient documentation

## 2022-12-30 DIAGNOSIS — N200 Calculus of kidney: Secondary | ICD-10-CM | POA: Diagnosis not present

## 2022-12-30 DIAGNOSIS — R319 Hematuria, unspecified: Secondary | ICD-10-CM | POA: Diagnosis not present

## 2022-12-30 LAB — CBC WITH DIFFERENTIAL/PLATELET
Abs Immature Granulocytes: 0.04 10*3/uL (ref 0.00–0.07)
Basophils Absolute: 0.1 10*3/uL (ref 0.0–0.1)
Basophils Relative: 1 %
Eosinophils Absolute: 0.1 10*3/uL (ref 0.0–0.5)
Eosinophils Relative: 1 %
HCT: 46.2 % — ABNORMAL HIGH (ref 36.0–46.0)
Hemoglobin: 14.9 g/dL (ref 12.0–15.0)
Immature Granulocytes: 0 %
Lymphocytes Relative: 18 %
Lymphs Abs: 2 10*3/uL (ref 0.7–4.0)
MCH: 27.3 pg (ref 26.0–34.0)
MCHC: 32.3 g/dL (ref 30.0–36.0)
MCV: 84.6 fL (ref 80.0–100.0)
Monocytes Absolute: 0.6 10*3/uL (ref 0.1–1.0)
Monocytes Relative: 6 %
Neutro Abs: 8.2 10*3/uL — ABNORMAL HIGH (ref 1.7–7.7)
Neutrophils Relative %: 74 %
Platelets: 292 10*3/uL (ref 150–400)
RBC: 5.46 MIL/uL — ABNORMAL HIGH (ref 3.87–5.11)
RDW: 13.4 % (ref 11.5–15.5)
WBC: 11 10*3/uL — ABNORMAL HIGH (ref 4.0–10.5)
nRBC: 0 % (ref 0.0–0.2)

## 2022-12-30 LAB — URINALYSIS, W/ REFLEX TO CULTURE (INFECTION SUSPECTED)
Bacteria, UA: NONE SEEN
Bilirubin Urine: NEGATIVE
Glucose, UA: NEGATIVE mg/dL
Nitrite: NEGATIVE
Protein, ur: 30 mg/dL — AB
RBC / HPF: >50 RBC/hpf (ref 0–5)
Specific Gravity, Urine: 1.02 (ref 1.005–1.030)
WBC, UA: >50 WBC/hpf (ref 0–5)
pH: 6 (ref 5.0–8.0)

## 2022-12-30 LAB — COMPREHENSIVE METABOLIC PANEL
ALT: 11 U/L (ref 0–44)
AST: 15 U/L (ref 15–41)
Albumin: 4 g/dL (ref 3.5–5.0)
Alkaline Phosphatase: 84 U/L (ref 38–126)
Anion gap: 8 (ref 5–15)
BUN: 9 mg/dL (ref 6–20)
CO2: 27 mmol/L (ref 22–32)
Calcium: 10.9 mg/dL — ABNORMAL HIGH (ref 8.9–10.3)
Chloride: 104 mmol/L (ref 98–111)
Creatinine, Ser: 1.16 mg/dL — ABNORMAL HIGH (ref 0.44–1.00)
GFR, Estimated: 55 mL/min — ABNORMAL LOW (ref >60)
Glucose, Bld: 181 mg/dL — ABNORMAL HIGH (ref 70–99)
Potassium: 3.9 mmol/L (ref 3.5–5.1)
Sodium: 139 mmol/L (ref 135–145)
Total Bilirubin: 0.3 mg/dL (ref 0.3–1.2)
Total Protein: 6.5 g/dL (ref 6.5–8.1)

## 2022-12-30 LAB — PREGNANCY, URINE: Preg Test, Ur: NEGATIVE

## 2022-12-30 MED ORDER — CEPHALEXIN 500 MG PO CAPS: 500.0000 mg | ORAL_CAPSULE | Freq: Four times a day (QID) | ORAL | 0 refills | Status: AC

## 2022-12-30 MED ORDER — FLUCONAZOLE 200 MG PO TABS: 200.0000 mg | ORAL_TABLET | Freq: Every day | ORAL | 0 refills | Status: AC

## 2022-12-30 NOTE — ED Triage Notes (Signed)
Pt has been having hematuria since Monday. No dysuria. Does c/o right flank pain off and on for a couple of months. Denies urinary frequency but does have ongoing problems with urinary urgency. Denies fever chills

## 2022-12-30 NOTE — ED Provider Notes (Signed)
[place] EMERGENCY DEPARTMENT AT [facility] Provider Note   CSN: [id number] Arrival date & time: 12/30/22  1409     History  Chief Complaint  Patient presents with   Hematuria   Flank Pain    Debra Mathews is a 57 y.o. female.  Patient is a 57 year old female with a history of Addison's disease, diabetes, hypertension, anemia, rheumatoid arthritis on Solu-Cortef twice a day who is presenting today with complaint of hematuria.  She noticed it first on Monday where it was dark red and even a little bit of clot in the toilet bowl but resolved after a few times urinating and then was only faintly pink intermittently throughout the week until again today when it started becoming bright red again.  She denies any vaginal bleeding or blood in her stools.  She has noticed in the last few days some minimal discomfort or pressure sensation in the right side of the abdomen and minimal right-sided back pain but no fevers, vomiting.  She denies history of similar.  No history of anticoagulation.  The history is provided by the patient.  Hematuria  Flank Pain       Home Medications Prior to Admission medications   Medication Sig Start Date End Date Taking? Authorizing Provider  cephALEXin (KEFLEX) 500 MG capsule Take 1 capsule (500 mg total) by mouth 4 (four) times daily for 7 days. 12/30/22 01/06/23 Yes Arfa Lamarca, Alphonzo Lemmings, MD  fluconazole (DIFLUCAN) 200 MG tablet Take 1 tablet (200 mg total) by mouth daily for 1 dose. 12/30/22 12/31/22 Yes Rhian Funari, Alphonzo Lemmings, MD  amLODipine (NORVASC) 5 MG tablet TAKE 2 TABLETS BY MOUTH DAILY. 10/14/19 09/15/22  Shirlean Mylar, MD  blood glucose meter kit and supplies KIT Dispense based on patient and insurance preference. Use up to four times daily as directed. 11/17/21   Cheryll Cockayne, MD  Continuous Glucose Sensor (FREESTYLE LIBRE 3 SENSOR) MISC 1 Device by Does not apply route every 14 (fourteen) days. 09/15/22   Shamleffer, Konrad Dolores, MD  doxycycline  (VIBRA-TABS) 100 MG tablet Take 1 tablet (100 mg total) by mouth 2 (two) times daily. 09/15/22   Shamleffer, Konrad Dolores, MD  Dulaglutide (TRULICITY) 0.75 MG/0.5ML SOPN Inject 0.75 mg into the skin once a week. 11/21/22   Shamleffer, Konrad Dolores, MD  escitalopram (LEXAPRO) 10 MG tablet Take 10 mg by mouth daily. 09/01/22   [provider]  HUMALOG KWIKPEN 100 UNIT/ML KwikPen Max daily 60 units 09/15/22   Shamleffer, Konrad Dolores, MD  hydrocortisone (CORTEF) 10 MG tablet Take 2 tablets with breakfast, and 1 tablet between 2 and 4 PM 11/16/22   Shamleffer, Konrad Dolores, MD  Insulin Glargine (BASAGLAR KWIKPEN) 100 UNIT/ML Inject 30 Units into the skin daily. 09/19/22   Shamleffer, Konrad Dolores, MD  Insulin Pen Needle 31G X 8 MM MISC 1 Device by Does not apply route in the morning, at noon, in the evening, and at bedtime. 09/15/22   Shamleffer, Konrad Dolores, MD  ipratropium (ATROVENT) 0.06 % nasal spray as needed. 04/16/19   [provider]  levocetirizine (XYZAL) 5 MG tablet Take 5 mg by mouth every evening. 08/07/19   [provider]  levocetirizine (XYZAL) 5 MG tablet TAKE 1 TABLET BY MOUTH EVERY EVENING. 11/05/19 11/04/20  Shirlean Mylar, MD  nitroGLYCERIN (NITROSTAT) 0.4 MG SL tablet Place 1 tablet (0.4 mg total) under the tongue every 5 (five) minutes as needed for chest pain. 07/23/19 10/21/19  Tobb, Kardie, DO  rosuvastatin (CRESTOR) 10 MG tablet Take  10 mg by mouth at bedtime. 07/12/22   [provider]  sulfaSALAzine (AZULFIDINE) 500 MG tablet Take 1,000 mg by mouth 2 (two) times daily. 10/12/21   [provider]  tirzepatide Greggory Keen) 2.5 MG/0.5ML Pen Inject 2.5 mg into the skin once a week. 11/14/22   Shamleffer, Konrad Dolores, MD  tizanidine (ZANAFLEX) 2 MG capsule Take 2 mg by mouth 3 (three) times daily.    [provider]  valsartan (DIOVAN) 160 MG tablet Take 160 mg by mouth daily.    [provider]  valsartan (DIOVAN) 160 MG  tablet TAKE 1 TABLET BY MOUTH ONCE DAILY 01/30/20 01/29/21  Shirlean Mylar, MD  venlafaxine XR (EFFEXOR-XR) 150 MG 24 hr capsule Take 1 capsule (150 mg total) by mouth daily. 11/23/20   Milagros Evener, MD  azelastine (ASTELIN) 0.1 % nasal spray Place 1 spray into both nostrils 2 (two) times daily. Use in each nostril as directed Patient not taking: Reported on 09/21/2018 06/27/18 02/23/19  Zachery Dauer, NP  fluticasone [facility]) 50 MCG/ACT nasal spray Place 1 spray into both nostrils daily. 07/01/18 06/11/19  Merrilee Jansky, MD  loratadine (CLARITIN) 10 MG tablet Take 10 mg by mouth as needed for allergies.   02/23/19  [provider]  sodium chloride (OCEAN) 0.65 % SOLN nasal spray Place 1 spray into both nostrils as needed for congestion. Patient not taking: Reported on 09/21/2018 07/01/18 02/23/19  Merrilee Jansky, MD      Allergies    Metformin and related and Oseltamivir    Review of Systems   Review of Systems  Genitourinary:  Positive for flank pain and hematuria.    Physical Exam Updated Vital Signs BP 139/72 (BP Location: Right Arm)   Pulse 99   Temp 98.5 F (36.9 C) (Oral)   Resp 18   Ht 5\' 6"  (1.676 m)   Wt (!) 140.5 kg   SpO2 99%   BMI 49.99 kg/m  Physical Exam Vitals and nursing note reviewed.  Constitutional:      General: She is not in acute distress.    Appearance: She is well-developed.  HENT:     Head: Normocephalic and atraumatic.  Eyes:     Pupils: Pupils are equal, round, and reactive to light.  Cardiovascular:     Rate and Rhythm: Regular rhythm. Tachycardia present.     Heart sounds: Normal heart sounds. No murmur heard.    No friction rub.  Pulmonary:     Effort: Pulmonary effort is normal.     Breath sounds: Normal breath sounds. No wheezing or rales.  Abdominal:     General: Bowel sounds are normal. There is no distension.     Palpations: Abdomen is soft.     Tenderness: There is no abdominal tenderness. There is right CVA tenderness. There  is no guarding or rebound.  Musculoskeletal:        General: No tenderness. Normal range of motion.     Right lower leg: No edema.     Left lower leg: No edema.     Comments: No edema  Skin:    General: Skin is warm and dry.     Findings: No rash.     Comments: No petechia  Neurological:     Mental Status: She is alert and oriented to person, place, and time.     Cranial Nerves: No cranial nerve deficit.  Psychiatric:        Behavior: Behavior normal.     ED Results /  Procedures / Treatments   Labs (all labs ordered are listed, but only abnormal results are displayed) Labs Reviewed  CBC WITH DIFFERENTIAL/PLATELET - Abnormal; Notable for the following components:      Result Value   WBC 11.0 (*)    RBC 5.46 (*)    HCT 46.2 (*)    Neutro Abs 8.2 (*)    All other components within normal limits  COMPREHENSIVE METABOLIC PANEL - Abnormal; Notable for the following components:   Glucose, Bld 181 (*)    Creatinine, Ser 1.16 (*)    Calcium 10.9 (*)    GFR, Estimated 55 (*)    All other components within normal limits  URINALYSIS, W/ REFLEX TO CULTURE (INFECTION SUSPECTED) - Abnormal; Notable for the following components:   APPearance HAZY (*)    Hgb urine dipstick LARGE (*)    Ketones, ur TRACE (*)    Protein, ur 30 (*)    Leukocytes,Ua LARGE (*)    All other components within normal limits  PREGNANCY, URINE    EKG None  Radiology CT Renal Stone Study  Result Date: 12/30/2022 CLINICAL DATA:  Right flank pain for several months.  Hematuria. EXAM: CT ABDOMEN AND PELVIS WITHOUT CONTRAST TECHNIQUE: Multidetector CT imaging of the abdomen and pelvis was performed following the standard protocol without IV contrast. RADIATION DOSE REDUCTION: This exam was performed according to the departmental dose-optimization program which includes automated exposure control, adjustment of the mA and/or kV according to patient size and/or use of iterative reconstruction technique. COMPARISON:   None Available. FINDINGS: Lower chest: No acute findings. Hepatobiliary:  No mass visualized on this unenhanced exam. Pancreas: No mass or inflammatory process visualized on this unenhanced exam. Spleen:  Within normal limits in size. Adrenals/Urinary tract: Small bilateral renal calculi are seen, largest measuring 4 mm in the left kidney. No evidence of urolithiasis or hydronephrosis. Unremarkable unopacified urinary bladder. Stomach/Bowel: No evidence of obstruction, inflammatory process, or abnormal fluid collections. Vascular/Lymphatic: No pathologically enlarged lymph nodes identified. No evidence of abdominal aortic aneurysm. Reproductive:  No mass or other significant abnormality. Other:  None. Musculoskeletal:  No suspicious bone lesions identified. IMPRESSION: Bilateral nephrolithiasis. No evidence of urolithiasis, hydronephrosis, or other acute findings. Electronically Signed   By: Danae Orleans M.D.   On: 12/30/2022 16:13    Procedures Procedures    Medications Ordered in ED Medications - No data to display  ED Course/ Medical Decision Making/ A&P                                 Medical Decision Making Amount and/or Complexity of Data Reviewed Labs: ordered. Decision-making details documented in ED Course. Radiology: ordered and independent interpretation performed. Decision-making details documented in ED Course.  Risk Prescription drug management.   Pt with multiple medical problems and comorbidities and presenting today with a complaint that caries a high risk for morbidity and mortality.  Pt with symptoms consistent with kidney stone vs pyelonephritis vs renal mass due to hematuria and minimal flank pain.  Denies infectious sx, or GI symptoms.  Low concern for diverticulitis and AAA.  .  Will ensure no infection with UA, CBC, CMP and will get stone study to further eval.  5:09 PM I independently interpreted patient's labs and CBC with white count of 11, normal hemoglobin, CMP  with unchanged kidney function of 1.16 and normal electrolytes UA today with findings concerning for UTI with large leukocytes, hemoglobin  and greater than 50 white and red cells.  A culture was done.  I have independently visualized and interpreted pt's images today.  Renal CT showed evidence of bilateral kidney stones but does not have hydronephrosis and does not appear to have any obstructing stones at this time.  Radiology reports bilateral nephrolithiasis without other acute findings.  This was discussed with the patient.  Will discharge home on antibiotics and given return precautions.  No indication for admission this time patient is comfortable with this plan.  No social determinants affecting her discharge today.          Final Clinical Impression(s) / ED Diagnoses Final diagnoses:  Acute cystitis with hematuria    Rx / DC Orders ED Discharge Orders          Ordered    cephALEXin (KEFLEX) 500 MG capsule  4 times daily        12/30/22 1707    fluconazole (DIFLUCAN) 200 MG tablet  Daily        12/30/22 1707              Gwyneth Sprout, MD 12/30/22 1709

## 2022-12-30 NOTE — ED Notes (Signed)
Pt discharged to home using teachback Method. Discharge instructions have been discussed with patient and/or family members. Pt verbally acknowledges understanding d/c instructions, has been given opportunity for questions to be answered, and endorses comprehension to checkout at registration before leaving.  

## 2022-12-30 NOTE — Discharge Instructions (Signed)
It appears that you have a urinary tract infection today.  You do have kidney stones in your kidneys but none that are obstructing your ureters.  You can start your antibiotic tonight.  If you start having high fevers, vomiting, passing out symptoms are getting worse not better return to the emergency room.

## 2022-12-30 NOTE — ED Notes (Signed)
Pt discharged home and given discharge paperwork. Opportunities given for questions. Pt verbalizes understanding. PIV removed x1. Stone,Heather R , RN 

## 2023-01-08 DIAGNOSIS — Z419 Encounter for procedure for purposes other than remedying health state, unspecified: Secondary | ICD-10-CM | POA: Diagnosis not present

## 2023-01-17 ENCOUNTER — Other Ambulatory Visit (HOSPITAL_COMMUNITY): Payer: Self-pay

## 2023-01-23 ENCOUNTER — Ambulatory Visit: Payer: Commercial Managed Care - HMO | Admitting: Internal Medicine

## 2023-01-23 NOTE — Progress Notes (Deleted)
Name: Debra Mathews  MRN/ DOB: [id number], 10-12-1965   Age/ Sex: 57 y.o., female    PCP: Camie Patience, FNP   Reason for Endocrinology Evaluation: Type 2 Diabetes Mellitus     Date of Initial Endocrinology Visit: 06/07/2022    PATIENT IDENTIFIER: Debra Mathews is a 57 y.o. female with a past medical history of DM, HTN, OSA on CPAP ,anemia . The patient presented for initial endocrinology clinic visit on 06/07/2022 for consultative assistance with her diabetes management.    HPI: Debra Mathews was    Diagnosed with DM 11/2021, when she presented to urgent care with symptomatic hyperglycemia was noted with a BG 395 mg/dL and N5A of 2.1% Prior Medications tried/Intolerance: On her initial diagnosis she was started on metformin and glipizide but she developed explosive diarrhea to metformin , she was subsequently started on insulin but due to hypoglycemia she stopped it around 11/ 2023      Hemoglobin A1c has ranged from 6.1% in 2023, peaking at 9.3% in 2023.  On her initial visit to our clinic her A1c was 6.1%, she was not on any glycemic agents  Unfortunately, by her return 09/2022 she had already restarted basal/prandial insulin due to hyperglycemia in the 300s mg/DL  She was prescribed Ozempic 09/2022 as her insurance was not covering Mounjaro despite multiple attempts at prior authorization  ADRENAL HISTORY: Patient had concerns related to adrenal insufficiency, she has rheumatoid arthritis and takes glucocorticoids intermittently for flareups.   She was having extreme fatigue, dizziness as well as hyperhidrosis with minimal activity as well as hypoglycemia with no glycemic agents  Cosyntropin stimulation test was abnormal at 17.6 ug/dL at 60 minutes She was started on hydrocortisone by 06/2022  Pt with OSA  on CPAP   SUBJECTIVE:   During the last visit (09/15/2022): A1c 7.6%    Today (01/23/23): Debra Mathews is here for a follow up on Diabetes management and adrenal  insufficiency.  She  checks her blood sugars multiple  times daily through CGM but has not been wearing the sensor   She presented to the ED with hematuria and flank pain 12/30/2022      HOME ENDOCRINE REGIMEN: HC 10 mg 2 tabs QAM and 1 tab QPM Ozempic 0.25 mg weekly Basaglar 30 units daily  Humalog 10-14 units TIDQAC CF: Humalog (BG -130/30) TIDQAC   Statin: no ACE-I/ARB: yes   CONTINUOUS GLUCOSE MONITORING RECORD INTERPRETATION    Dates of Recording: 4/20-09/09/2022  Sensor description: [employer] 2  Results statistics:   CGM use % of time 95%  Average and SD 179/27.9  Time in range     54   %  % Time Above 180 35  % Time above 250 11  % Time Below target 0   Glycemic patterns summary: BG's trend down overnight and increased throughout the day  Hyperglycemic episodes postprandial  Hypoglycemic episodes occurred N/A  Overnight periods: Trends down    DIABETIC COMPLICATIONS: Microvascular complications:  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  Denies: CKD  Last eye exam: Completed 2023  Macrovascular complications:   Denies: CAD, PVD, CVA   PAST HISTORY: Past Medical History:  Past Medical History:  Diagnosis Date   'Light-for-dates' infant with signs of fetal malnutrition 11/01/2016   Acne    adult   Anxiety    Arthritis 07/23/2019   Chest pain 07/23/2019   Elevated BP    no dx of HTN   Fibroids  History of prolonged Q-T interval on ECG 07/23/2019   HTN (hypertension), benign 10/18/2011   Iron deficiency anemia 10/18/2011   Hb 9.1 MCV 65.8 08/24/11 hx menorrhagia/fibroids/nl GYN exam   Menorrhagia with regular cycle 10/18/2011   Obesity    Obesity, Class III, BMI 40-49.9 (morbid obesity) (HCC) 07/12/2016   Shortness of breath 07/23/2019   Sinus tachycardia    Past Surgical History:  Past Surgical History:  Procedure Laterality Date   APPENDECTOMY     MYOMECTOMY     OVARIAN CYST REMOVAL     ROOT CANAL  2018    Social History:   reports that she has never smoked. She has never used smokeless tobacco. She reports that she does not drink alcohol and does not use drugs. Family History:  Family History  Problem Relation Age of Onset   Osteoarthritis Mother    COPD Mother    Diabetes Mother    Hypertension Mother    Cancer - Lung Mother    Cancer - Colon Mother    Cancer Mother        breast    Osteoarthritis Father    Diabetes Father    Diabetes Brother    Hypertension Brother    Stroke Brother    Hypertension Brother    Raynaud syndrome Daughter      HOME MEDICATIONS: Allergies as of 01/23/2023       Reactions   Metformin And Related Diarrhea   Oseltamivir Diarrhea, Nausea And Vomiting        Medication List        Accurate as of January 23, 2023  7:20 AM. If you have any questions, ask your nurse or doctor.          amLODipine 5 MG tablet Commonly known as: NORVASC TAKE 2 TABLETS BY MOUTH DAILY.   Basaglar KwikPen 100 UNIT/ML Inject 30 Units into the skin daily.   blood glucose meter kit and supplies Kit Dispense based on patient and insurance preference. Use up to four times daily as directed.   doxycycline 100 MG tablet Commonly known as: VIBRA-TABS Take 1 tablet (100 mg total) by mouth 2 (two) times daily.   escitalopram 10 MG tablet Commonly known as: LEXAPRO Take 10 mg by mouth daily.   FreeStyle Libre 3 Sensor Misc 1 Device by Does not apply route every 14 (fourteen) days.   HumaLOG KwikPen 100 UNIT/ML KwikPen Generic drug: insulin lispro Max daily 60 units   hydrocortisone 10 MG tablet Commonly known as: Cortef Take 2 tablets with breakfast, and 1 tablet between 2 and 4 PM   Insulin Pen Needle 31G X 8 MM Misc 1 Device by Does not apply route in the morning, at noon, in the evening, and at bedtime.   ipratropium 0.06 % nasal spray Commonly known as: ATROVENT as needed.   levocetirizine 5 MG tablet Commonly known as: XYZAL Take 5 mg by mouth every evening.    levocetirizine 5 MG tablet Commonly known as: XYZAL TAKE 1 TABLET BY MOUTH EVERY EVENING.   nitroGLYCERIN 0.4 MG SL tablet Commonly known as: NITROSTAT Place 1 tablet (0.4 mg total) under the tongue every 5 (five) minutes as needed for chest pain.   rosuvastatin 10 MG tablet Commonly known as: CRESTOR Take 10 mg by mouth at bedtime.   sulfaSALAzine 500 MG tablet Commonly known as: AZULFIDINE Take 1,000 mg by mouth 2 (two) times daily.   tirzepatide 2.5 MG/0.5ML Pen Commonly known as: MOUNJARO Inject 2.5 mg into  the skin once a week.   tizanidine 2 MG capsule Commonly known as: ZANAFLEX Take 2 mg by mouth 3 (three) times daily.   Trulicity 0.75 MG/0.5ML Sopn Generic drug: Dulaglutide Inject 0.75 mg into the skin once a week.   valsartan 160 MG tablet Commonly known as: DIOVAN Take 160 mg by mouth daily.   valsartan 160 MG tablet Commonly known as: DIOVAN TAKE 1 TABLET BY MOUTH ONCE DAILY   venlafaxine XR 150 MG 24 hr capsule Commonly known as: EFFEXOR-XR Take 1 capsule (150 mg total) by mouth daily.         ALLERGIES: Allergies  Allergen Reactions   Metformin And Related Diarrhea   Oseltamivir Diarrhea and Nausea And Vomiting     REVIEW OF SYSTEMS: A comprehensive ROS was conducted with the patient and is negative except as per HPI     OBJECTIVE:   VITAL SIGNS: There were no vitals taken for this visit.     There is no height or weight on file to calculate BSA.  PHYSICAL EXAM:  General: Pt appears well and is in NAD  Lungs: Clear with good BS bilat with no rales, rhonchi, or wheezes  Heart: RRR   Abdomen:  soft, nontender  Extremities:  Lower extremities - No pretibial edema.   Neuro: MS is good with appropriate affect, pt is alert and Ox3    DM foot exam: 06/07/2022  The skin of the feet is intact without sores or ulcerations. The pedal pulses are 2+ on right and 2+ on left. The sensation is intact to a screening 5.07, 10 gram  monofilament bilaterally   DATA REVIEWED:   Latest Reference Range & Units 12/30/22 16:19  Sodium 135 - 145 mmol/L 139  Potassium 3.5 - 5.1 mmol/L 3.9  Chloride 98 - 111 mmol/L 104  CO2 22 - 32 mmol/L 27  Glucose 70 - 99 mg/dL 130 (H)  BUN 6 - 20 mg/dL 9  Creatinine 8.65 - 7.84 mg/dL 6.96 (H)  Calcium 8.9 - 10.3 mg/dL 29.5 (H)  Anion gap 5 - 15  8  Alkaline Phosphatase 38 - 126 U/L 84  Albumin 3.5 - 5.0 g/dL 4.0  AST 15 - 41 U/L 15  ALT 0 - 44 U/L 11  Total Protein 6.5 - 8.1 g/dL 6.5  Total Bilirubin 0.3 - 1.2 mg/dL 0.3  GFR, Estimated >28 mL/min 55 (L)  (H): Data is abnormally high (L): Data is abnormally low  ASSESSMENT / PLAN / RECOMMENDATIONS:   1) Type 2 Diabetes Mellitus, Sub-optimally controlled, Without complications - Most recent A1c of 7.6 %. Goal A1c < 7.0 %.    -Patient has been noted with hyperglycemia and she is back on basal/prandial insulin -She denies any changes in her diet, but the patient has been noted with late night snacking at times -She is intolerant to metformin -She will continue with [place] and Humalog, she will be provided with correction scale to be used before each meal -We attempted to prescribe Mounjaro, but this is not covered by her insurance, Ozempic was sent instead -Evaristo Bury is not on the formulary, will switch to [employer]  MEDICATIONS: Switch Tresiba to [employer] 30 units daily Continue Humalog 10-14 units 3 times daily before every meal CF: Humalog (BG -130/30) 3 times daily before every meal Ozempic 0.25 mg weekly  EDUCATION / INSTRUCTIONS: BG monitoring instructions: Patient is instructed to check her blood sugars 3 times a day. Call [facility] if: BG persistently < 70  I  reviewed the Rule of 15 for the treatment of hypoglycemia in detail with the patient. Literature supplied.   2) Diabetic complications:  Eye: Does not have known diabetic retinopathy.  Neuro/ Feet: Does not have known diabetic peripheral  neuropathy. Renal: Patient does not have known baseline CKD. She is  on an ACEI/ARB at present.  3) Adrenal insufficiency:  -Patient had an abnormal cosyntropin stimulation test 06/2022 and was started on hydrocortisone -Discussed sick day will by doubling up with severe illness for 2-3 days during the acute phase and returning to regular dose after that  Medication Continue hydrocortisone 10 mg, 2 tabs with breakfast and 1 tab between 2-4 PM   4)Dyslipidemia:  -LDL above goal -She is on rosuvastatin 10 mg daily, will continue to monitor, if this remains elevated will increase in the future  Follow-up 4 months   Signed electronically by: Lyndle Herrlich, MD  [facility] Endocr[facility]ology  [facility] [address] [place] [place], [place] [address] Phone: [phone] FAX: [phone]   CC: Camie Patience, FNP [address] [place] [address] Phone: [phone]  Fax: [phone]    Return to Endocrinology clinic as below: Future Appointments  Date Time Provider Department Center  01/23/2023 12:10 PM Debra Mathews, Konrad Dolores, MD [id number] None

## 2023-01-26 ENCOUNTER — Other Ambulatory Visit: Payer: Self-pay | Admitting: Internal Medicine

## 2023-01-27 ENCOUNTER — Other Ambulatory Visit (HOSPITAL_COMMUNITY): Payer: Self-pay

## 2023-01-27 ENCOUNTER — Telehealth: Payer: Self-pay

## 2023-01-27 ENCOUNTER — Other Ambulatory Visit: Payer: Self-pay

## 2023-01-27 MED ORDER — FREESTYLE LIBRE 3 PLUS SENSOR MISC: 3 refills | Status: DC

## 2023-01-27 NOTE — Telephone Encounter (Signed)
PA needed for [place] 3 plus

## 2023-01-27 NOTE — Telephone Encounter (Signed)
Pharmacy Patient Advocate Encounter   Received notification from Pt Calls Messages that prior authorization for Freestyle libre 3 plus is required/requested.    Per test claim: PA required; PA submitted to [facility] via CoverMyMeds Key/confirmation #/EOC [id number] Status is pending

## 2023-02-01 NOTE — Telephone Encounter (Signed)
Pharmacy Patient Advocate Encounter  Received notification from [facility] that Prior Authorization for [facility] 3 plus has been DENIED.  Full denial letter will be uploaded to the media tab. See denial reason below.   PA #/Case ID/Reference #: [id number]

## 2023-02-07 DIAGNOSIS — Z419 Encounter for procedure for purposes other than remedying health state, unspecified: Secondary | ICD-10-CM | POA: Diagnosis not present

## 2023-03-10 DIAGNOSIS — Z419 Encounter for procedure for purposes other than remedying health state, unspecified: Secondary | ICD-10-CM | POA: Diagnosis not present

## 2023-04-04 ENCOUNTER — Encounter: Payer: Self-pay | Admitting: Internal Medicine

## 2023-04-05 ENCOUNTER — Other Ambulatory Visit: Payer: Self-pay

## 2023-04-05 MED ORDER — FREESTYLE LIBRE 3 PLUS SENSOR MISC: 3 refills | Status: DC

## 2023-04-05 MED ORDER — FREESTYLE LIBRE 3 SENSOR MISC: 1.0000 | 3 refills | Status: DC

## 2023-04-05 MED ORDER — TRESIBA FLEXTOUCH 100 UNIT/ML ~~LOC~~ SOPN: 30.0000 [IU] | PEN_INJECTOR | Freq: Every day | SUBCUTANEOUS | 3 refills | Status: DC

## 2023-04-09 DIAGNOSIS — Z419 Encounter for procedure for purposes other than remedying health state, unspecified: Secondary | ICD-10-CM | POA: Diagnosis not present

## 2023-05-10 DIAGNOSIS — Z419 Encounter for procedure for purposes other than remedying health state, unspecified: Secondary | ICD-10-CM | POA: Diagnosis not present

## 2023-05-11 ENCOUNTER — Telehealth: Payer: Self-pay

## 2023-05-11 ENCOUNTER — Other Ambulatory Visit (HOSPITAL_COMMUNITY): Payer: Self-pay

## 2023-05-11 NOTE — Telephone Encounter (Signed)
 Received message about which long acting insulin is covered by insurance.   Test claim shows Lantus is covered with a $4 copay

## 2023-05-16 ENCOUNTER — Other Ambulatory Visit: Payer: Self-pay | Admitting: Internal Medicine

## 2023-05-16 ENCOUNTER — Other Ambulatory Visit (HOSPITAL_COMMUNITY): Payer: Self-pay

## 2023-05-16 MED ORDER — LANTUS SOLOSTAR 100 UNIT/ML ~~LOC~~ SOPN
30.0000 [IU] | PEN_INJECTOR | Freq: Every day | SUBCUTANEOUS | 3 refills | Status: DC
  Filled 2023-05-16: qty 30, 90d supply, fill #0
  Filled 2023-08-23: qty 30, 90d supply, fill #1

## 2023-05-16 NOTE — Telephone Encounter (Signed)
 Lantus sent

## 2023-06-10 DIAGNOSIS — Z419 Encounter for procedure for purposes other than remedying health state, unspecified: Secondary | ICD-10-CM | POA: Diagnosis not present

## 2023-07-08 DIAGNOSIS — Z419 Encounter for procedure for purposes other than remedying health state, unspecified: Secondary | ICD-10-CM | POA: Diagnosis not present

## 2023-08-19 DIAGNOSIS — Z419 Encounter for procedure for purposes other than remedying health state, unspecified: Secondary | ICD-10-CM | POA: Diagnosis not present

## 2023-08-23 ENCOUNTER — Other Ambulatory Visit (HOSPITAL_COMMUNITY): Payer: Self-pay

## 2023-09-18 DIAGNOSIS — Z419 Encounter for procedure for purposes other than remedying health state, unspecified: Secondary | ICD-10-CM | POA: Diagnosis not present

## 2023-09-30 ENCOUNTER — Other Ambulatory Visit: Payer: Self-pay | Admitting: Internal Medicine

## 2023-09-30 DIAGNOSIS — E1165 Type 2 diabetes mellitus with hyperglycemia: Secondary | ICD-10-CM

## 2023-10-11 ENCOUNTER — Other Ambulatory Visit: Payer: Self-pay

## 2023-10-11 MED ORDER — HUMALOG KWIKPEN 100 UNIT/ML ~~LOC~~ SOPN: PEN_INJECTOR | SUBCUTANEOUS | 0 refills | Status: DC

## 2023-10-11 MED ORDER — LANTUS SOLOSTAR 100 UNIT/ML ~~LOC~~ SOPN: 30.0000 [IU] | PEN_INJECTOR | Freq: Every day | SUBCUTANEOUS | 0 refills | Status: DC

## 2023-10-19 DIAGNOSIS — Z419 Encounter for procedure for purposes other than remedying health state, unspecified: Secondary | ICD-10-CM | POA: Diagnosis not present

## 2023-11-09 ENCOUNTER — Other Ambulatory Visit: Payer: Self-pay | Admitting: Internal Medicine

## 2023-11-09 DIAGNOSIS — Z794 Long term (current) use of insulin: Secondary | ICD-10-CM

## 2023-11-18 DIAGNOSIS — Z419 Encounter for procedure for purposes other than remedying health state, unspecified: Secondary | ICD-10-CM | POA: Diagnosis not present

## 2023-12-01 ENCOUNTER — Encounter: Payer: Self-pay | Admitting: Internal Medicine

## 2023-12-01 ENCOUNTER — Telehealth: Payer: Self-pay

## 2023-12-01 ENCOUNTER — Ambulatory Visit (INDEPENDENT_AMBULATORY_CARE_PROVIDER_SITE_OTHER): Admitting: Internal Medicine

## 2023-12-01 VITALS — BP 142/100 | HR 101 | Resp 20 | Ht 66.0 in | Wt 311.2 lb

## 2023-12-01 DIAGNOSIS — E2740 Unspecified adrenocortical insufficiency: Secondary | ICD-10-CM | POA: Diagnosis not present

## 2023-12-01 DIAGNOSIS — E1165 Type 2 diabetes mellitus with hyperglycemia: Secondary | ICD-10-CM | POA: Diagnosis not present

## 2023-12-01 DIAGNOSIS — Z794 Long term (current) use of insulin: Secondary | ICD-10-CM

## 2023-12-01 LAB — POCT GLYCOSYLATED HEMOGLOBIN (HGB A1C): Hemoglobin A1C: 9.6 % — AB (ref 4.0–5.6)

## 2023-12-01 MED ORDER — LANTUS SOLOSTAR 100 UNIT/ML ~~LOC~~ SOPN: 30.0000 [IU] | PEN_INJECTOR | Freq: Every day | SUBCUTANEOUS | 2 refills | Status: DC

## 2023-12-01 MED ORDER — HYDROCORTISONE 10 MG PO TABS: 10.0000 mg | ORAL_TABLET | ORAL | 2 refills | Status: AC

## 2023-12-01 MED ORDER — LANTUS SOLOSTAR 100 UNIT/ML ~~LOC~~ SOPN: 45.0000 [IU] | PEN_INJECTOR | Freq: Every day | SUBCUTANEOUS | 2 refills | Status: AC

## 2023-12-01 MED ORDER — DEXCOM G7 SENSOR MISC: 1.0000 | 3 refills | Status: DC

## 2023-12-01 MED ORDER — OZEMPIC (0.25 OR 0.5 MG/DOSE) 2 MG/3ML ~~LOC~~ SOPN: 0.5000 mg | PEN_INJECTOR | SUBCUTANEOUS | 3 refills | Status: DC

## 2023-12-01 MED ORDER — INSULIN LISPRO (1 UNIT DIAL) 100 UNIT/ML (KWIKPEN): PEN_INJECTOR | SUBCUTANEOUS | 2 refills | Status: AC

## 2023-12-01 MED ORDER — INSULIN PEN NEEDLE 31G X 8 MM MISC: 1.0000 | Freq: Four times a day (QID) | 3 refills | Status: AC

## 2023-12-01 MED ORDER — ONDANSETRON 8 MG PO TBDP: 8.0000 mg | ORAL_TABLET | Freq: Three times a day (TID) | ORAL | 0 refills | Status: DC | PRN

## 2023-12-01 NOTE — Telephone Encounter (Signed)
 Pharmacy Patient Advocate Encounter   Received notification from CoverMyMeds that prior authorization for Ozempic is required/requested.   Insurance verification completed.   The patient is insured through [facility] .   Per test claim: PA required; PA submitted to above mentioned insurance via CoverMyMeds Key/confirmation #/EOC [id number] Status is pending

## 2023-12-01 NOTE — Patient Instructions (Signed)
 Start Ozempic 0.25 mg weekly for 6 weeks, then increase to 0.5 mg weekly Continue Lantus  30 units daily  Continue Humalog  12-14 units with each meal  Humalog  correctional insulin : ADD extra units on insulin  to your meal-time Humalog  dose if your blood sugars are higher than 160. Use the scale below to help guide you:   Blood sugar before meal Number of units to inject  Less than 160 0 unit  161 -  190 1 units  191 -  220 2 units  221 -  250 3 units  251 -  280 4 units  281 -  310 5 units  311 -  340 6 units  341 -  370 7 units  371 -  400 8 units   HOW TO TREAT LOW BLOOD SUGARS (Blood sugar LESS THAN 70 MG/DL) Please follow the RULE OF 15 for the treatment of hypoglycemia treatment (when your (blood sugars are less than 70 mg/dL)   STEP 1: Take 15 grams of carbohydrates when your blood sugar is low, which includes:  3-4 GLUCOSE TABS  OR 3-4 OZ OF JUICE OR REGULAR SODA OR ONE TUBE OF GLUCOSE GEL    STEP 2: RECHECK blood sugar in 15 MINUTES STEP 3: If your blood sugar is still low at the 15 minute recheck --> then, go back to STEP 1 and treat AGAIN with another 15 grams of carbohydrates.

## 2023-12-01 NOTE — Telephone Encounter (Signed)
 Pharmacy Patient Advocate Encounter  Received notification from [facility] that Prior Authorization for Ozempic 2mg /52ml has been APPROVED from 12/01/23 to 11/30/24

## 2023-12-01 NOTE — Progress Notes (Signed)
 Name: Debra Mathews  MRN/ DOB: [id number], 10/04/1965   Age/ Sex: 58 y.o., female    PCP: Dyane Anthony RAMAN, FNP   Reason for Endocrinology Evaluation: Type 2 Diabetes Mellitus     Date of Initial Endocrinology Visit: 06/07/2022    PATIENT IDENTIFIER: Debra Mathews is a 58 y.o. female with a past medical history of DM, HTN, OSA on CPAP ,anemia . The patient presented for initial endocrinology clinic visit on 06/07/2022 for consultative assistance with her diabetes management.    HPI: [name] was    Diagnosed with DM 11/2021, when she presented to urgent care with symptomatic hyperglycemia was noted with a BG 395 mg/dL and J8r of 0.6% Prior Medications tried/Intolerance: On her initial diagnosis she was started on metformin  and glipizide  but she developed explosive diarrhea to metformin  , she was subsequently started on insulin  but due to hypoglycemia she stopped it around 11/ 2023      Hemoglobin A1c has ranged from 6.1% in 2023, peaking at 9.3% in 2023.  On her initial visit to our clinic her A1c was 6.1%, she was not on any glycemic agents  Unfortunately, by her return 09/2022 she had already restarted basal/prandial insulin  due to hyperglycemia in the 300s mg/DL  ADRENAL HISTORY: Patient had concerns related to adrenal insufficiency, she has rheumatoid arthritis and takes glucocorticoids intermittently for flareups.   She was having extreme fatigue, dizziness as well as hyperhidrosis with minimal activity as well as hypoglycemia with no glycemic agents  Cosyntropin  stimulation test was abnormal at 17.6 ug/dL at 60 minutes She was started on hydrocortisone  by 06/2022  Pt with OSA  on CPAP   SUBJECTIVE:   During the last visit (09/15/2022): A1c 7.6%    Today (12/01/23): Debra Mathews is here for a follow up on Diabetes management and adrenal insufficiency.  She has not been using freestyle libre for glucose checks.  She does not have a glucose meter at home.  She has NOT been  to our clinic in 14 months  Not consistent with Humalog  every meal Denies nausea, or vomiting  Had recent diarrhea    HOME ENDOCRINE REGIMEN: HC 10 mg 2 tabs QAM and 1 tab QPM Lantus  30 units daily - 44 units  Humalog  12-14 units TIDQAC      Statin: no ACE-I/ARB: yes   CONTINUOUS GLUCOSE MONITORING RECORD INTERPRETATION      DIABETIC COMPLICATIONS: Microvascular complications:  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  Denies: CKD  Last eye exam: Completed 2023  Macrovascular complications:   Denies: CAD, PVD, CVA   PAST HISTORY: Past Medical History:  Past Medical History:  Diagnosis Date  . 'Light-for-dates' infant with signs of fetal malnutrition 11/01/2016  . Acne    adult  . Anxiety   . Arthritis 07/23/2019  . Chest pain 07/23/2019  . Elevated BP    no dx of HTN  . Fibroids   . History of prolonged Q-T interval on ECG 07/23/2019  . HTN (hypertension), benign 10/18/2011  . Iron deficiency anemia 10/18/2011   Hb 9.1 MCV 65.8 08/24/11 hx menorrhagia/fibroids/nl GYN exam  . Menorrhagia with regular cycle 10/18/2011  . Obesity   . Obesity, Class III, BMI 40-49.9 (morbid obesity) 07/12/2016  . Shortness of breath 07/23/2019  . Sinus tachycardia    Past Surgical History:  Past Surgical History:  Procedure Laterality Date  . APPENDECTOMY    . MYOMECTOMY    . OVARIAN CYST REMOVAL    .  ROOT CANAL  2018    Social History:  reports that she has never smoked. She has never used smokeless tobacco. She reports that she does not drink alcohol and does not use drugs. Family History:  Family History  Problem Relation Age of Onset  . Osteoarthritis Mother   . COPD Mother   . Diabetes Mother   . Hypertension Mother   . Cancer - Lung Mother   . Cancer - Colon Mother   . Cancer Mother        breast   . Osteoarthritis Father   . Diabetes Father   . Diabetes Brother   . Hypertension Brother   . Stroke Brother   . Hypertension Brother   . Raynaud syndrome  Daughter      HOME MEDICATIONS: Allergies as of 12/01/2023       Reactions   Metformin  And Related Diarrhea   Oseltamivir Diarrhea, Nausea And Vomiting        Medication List        Accurate as of December 01, 2023  1:44 PM. If you have any questions, ask your nurse or doctor.          amLODipine  5 MG tablet Commonly known as: NORVASC  TAKE 2 TABLETS BY MOUTH DAILY.   blood glucose meter kit and supplies Kit Dispense based on patient and insurance preference. Use up to four times daily as directed.   doxycycline  100 MG tablet Commonly known as: VIBRA -TABS Take 1 tablet (100 mg total) by mouth 2 (two) times daily.   escitalopram 10 MG tablet Commonly known as: LEXAPRO Take 10 mg by mouth daily.   FreeStyle Libre 3 Plus Sensor Misc Change sensor every 15 days.   HumaLOG  KwikPen 100 UNIT/ML KwikPen Generic drug: insulin  lispro Max daily 60 units   hydrocortisone  10 MG tablet Commonly known as: CORTEF  TAKE 2 TABLETS WITH BREAKFAST, AND 1 TABLET BETWEEN 2 AND 4 PM   Insulin  Pen Needle 31G X 8 MM Misc 1 Device by Does not apply route in the morning, at noon, in the evening, and at bedtime.   ipratropium 0.06 % nasal spray Commonly known as: ATROVENT as needed.   Lantus  SoloStar 100 UNIT/ML Solostar Pen Generic drug: insulin  glargine Inject 30 Units into the skin daily.   levocetirizine 5 MG tablet Commonly known as: XYZAL Take 5 mg by mouth every evening.   levocetirizine 5 MG tablet Commonly known as: XYZAL TAKE 1 TABLET BY MOUTH EVERY EVENING.   nitroGLYCERIN  0.4 MG SL tablet Commonly known as: NITROSTAT  Place 1 tablet (0.4 mg total) under the tongue every 5 (five) minutes as needed for chest pain.   rosuvastatin 10 MG tablet Commonly known as: CRESTOR Take 10 mg by mouth at bedtime.   sulfaSALAzine  500 MG tablet Commonly known as: AZULFIDINE  Take 1,000 mg by mouth 2 (two) times daily.   tirzepatide  2.5 MG/0.5ML Pen Commonly known as:  MOUNJARO  Inject 2.5 mg into the skin once a week.   tizanidine  2 MG capsule Commonly known as: ZANAFLEX  Take 2 mg by mouth 3 (three) times daily.   Trulicity  0.75 MG/0.5ML Soaj Generic drug: Dulaglutide  Inject 0.75 mg into the skin once a week.   valsartan  160 MG tablet Commonly known as: DIOVAN  Take 160 mg by mouth daily.   valsartan  160 MG tablet Commonly known as: DIOVAN  TAKE 1 TABLET BY MOUTH ONCE DAILY   venlafaxine  XR 150 MG 24 hr capsule Commonly known as: EFFEXOR -XR Take 1 capsule (150 mg total) by  mouth daily.         ALLERGIES: Allergies  Allergen Reactions  . Metformin  And Related Diarrhea  . Oseltamivir Diarrhea and Nausea And Vomiting     REVIEW OF SYSTEMS: A comprehensive ROS was conducted with the patient and is negative except as per HPI     OBJECTIVE:   VITAL SIGNS: BP (!) 142/100   Pulse (!) 101   Resp 20   Ht 5' 6 (1.676 m)   Wt (!) 311 lb 3.2 oz (141.2 kg)   SpO2 98%   BMI 50.23 kg/m      Body surface area is 2.56 meters squared.  PHYSICAL EXAM:  General: Pt appears well and is in NAD  Lungs: Clear with good BS bilat with no rales, rhonchi, or wheezes  Heart: RRR   Abdomen:  soft, nontender  Extremities:  Lower extremities - No pretibial edema.   Neuro: MS is good with appropriate affect, pt is alert and Ox3    DM foot exam: 12/01/2023  The skin of the feet is intact without sores or ulcerations. The pedal pulses are 2+ on right and 2+ on left. The sensation is intact to a screening 5.07, 10 gram monofilament bilaterally   DATA REVIEWED:   Latest Reference Range & Units 12/01/23 14:11  Sodium 135 - 146 mmol/L 139  Potassium 3.5 - 5.3 mmol/L 4.0  Chloride 98 - 110 mmol/L 103  CO2 20 - 32 mmol/L 26  Glucose 65 - 99 mg/dL 767 (H)  BUN 7 - 25 mg/dL 10  Creatinine 9.49 - 8.96 mg/dL 9.09  Calcium 8.6 - 89.5 mg/dL 89.0 (H)  BUN/Creatinine Ratio 6 - 22 (calc) SEE NOTE:  eGFR > OR = 60 mL/min/1.69m2 75  Total CHOL/HDL  Ratio <5.0 (calc) 4.2  Cholesterol <200 mg/dL 797 (H)  HDL Cholesterol > OR = 50 mg/dL 48 (L)  LDL Cholesterol (Calc) mg/dL (calc) 866 (H)  Non-HDL Cholesterol (Calc) <130 mg/dL (calc) 845 (H)  Triglycerides <150 mg/dL 892    Latest Reference Range & Units 12/01/23 14:11  TSH 0.40 - 4.50 mIU/L 1.86     Latest Reference Range & Units 12/01/23 14:27  Microalb, Ur mg/dL 2.1  MICROALB/CREAT RATIO <30 mg/g creat 18  Creatinine, Urine 20 - 275 mg/dL 885      ASSESSMENT / PLAN / RECOMMENDATIONS:   1) Type 2 Diabetes Mellitus, Poorly  controlled, Without complications - Most recent A1c of 9.6%. Goal A1c < 7.0 %.    -Patient continues with poor glycemic control -She has not been checking glucose, thus missing out on correction doses of Humalog  -She is intolerant to metformin  - A prescription for Dexcom was sent to the pharmacy - Historically she has not been able to obtain GLP-1 agonist due to insurance coverage, will prescribe Ozempic , patient understands GI side effects   MEDICATIONS: Lantus  44  units daily Continue Humalog  12-14 units 3 times daily before every meal CF: Humalog  (BG -130/30) 3 times daily before every meal Ozempic  0.25 mg weekly for 6 weeks then increase to 0.5 mg weekly  EDUCATION / INSTRUCTIONS: BG monitoring instructions: Patient is instructed to check her blood sugars 3 times a day. Call [facility] if: BG persistently < 70  I reviewed the Rule of 15 for the treatment of hypoglycemia in detail with the patient. Literature supplied.   2) Diabetic complications:  Eye: Does not have known diabetic retinopathy.  Neuro/ Feet: Does not have known diabetic peripheral neuropathy. Renal: Patient does not  have known baseline CKD. She is  on an ACEI/ARB at present.  3) Adrenal insufficiency:  -Patient had an abnormal cosyntropin  stimulation test 06/2022 and was started on hydrocortisone  -Discussed sick day will by doubling up with severe illness  for 2-3 days during the acute phase and returning to regular dose after that - She tends to forget her afternoon dose, discussed the importance of compliance - ACTH  pending today - No recent glucose records injections or oral other than hydrocortisone   Medication Continue hydrocortisone  10 mg, 2 tabs with breakfast and 1 tab between 2-4 PM   4)Dyslipidemia:  - Historically she has had LDL above goal - She is not on rosuvastatin, she is not sure why - LDL above goal, patient will be encouraged to restart statin therapy   Medication Rosuvastatin 10 mg daily  5) Morbid obesity  -A referral to surgery has been placed   6) Hypercalcemia:  - Serum calcium slightly elevated, will need further workup - In the meantime will encourage hydration, avoiding OTC calcium tablets, consuming 2-3 servings of dietary calcium daily  Follow-up 3 months   Signed electronically by: Stefano Redgie Butts, MD  [facility]  [facility] [address] [place] [place], [place] [address] Phone: [phone] FAX: [phone]   CC: Dyane Anthony RAMAN, FNP [address] [place] [address] Phone: [phone]  Fax: [phone]    Return to Endocrinology clinic as below: No future appointments.

## 2023-12-02 LAB — MICROALBUMIN / CREATININE URINE RATIO
Creatinine, Urine: 114 mg/dL (ref 20–275)
Microalb Creat Ratio: 18 mg/g{creat} (ref <30)
Microalb, Ur: 2.1 mg/dL

## 2023-12-04 ENCOUNTER — Ambulatory Visit: Payer: Self-pay | Admitting: Internal Medicine

## 2023-12-04 ENCOUNTER — Telehealth: Payer: Self-pay

## 2023-12-04 DIAGNOSIS — E2740 Unspecified adrenocortical insufficiency: Secondary | ICD-10-CM

## 2023-12-04 LAB — LIPID PANEL
Cholesterol: 202 mg/dL — ABNORMAL HIGH (ref <200)
HDL: 48 mg/dL — ABNORMAL LOW (ref >=50)
LDL Cholesterol (Calc): 133 mg/dL — ABNORMAL HIGH
Non-HDL Cholesterol (Calc): 154 mg/dL — ABNORMAL HIGH (ref <130)
Total CHOL/HDL Ratio: 4.2 (calc) (ref <5.0)
Triglycerides: 107 mg/dL (ref <150)

## 2023-12-04 LAB — BASIC METABOLIC PANEL WITH GFR
BUN: 10 mg/dL (ref 7–25)
CO2: 26 mmol/L (ref 20–32)
Calcium: 10.9 mg/dL — ABNORMAL HIGH (ref 8.6–10.4)
Chloride: 103 mmol/L (ref 98–110)
Creat: 0.9 mg/dL (ref 0.50–1.03)
Glucose, Bld: 232 mg/dL — ABNORMAL HIGH (ref 65–99)
Potassium: 4 mmol/L (ref 3.5–5.3)
Sodium: 139 mmol/L (ref 135–146)
eGFR: 75 mL/min/1.73m2 (ref >=60)

## 2023-12-04 LAB — TSH: TSH: 1.86 m[IU]/L (ref 0.40–4.50)

## 2023-12-04 LAB — ACTH: C206 ACTH: 11 pg/mL (ref 6–50)

## 2023-12-05 NOTE — Progress Notes (Signed)
 ATC 1X to schedule lab appt,mailbox isn't setup for messages.

## 2023-12-05 NOTE — Telephone Encounter (Signed)
 Please contact the patient and schedule her for fasting 8 AM lab at her convenience    Thanks

## 2023-12-07 NOTE — Telephone Encounter (Signed)
 Done

## 2023-12-15 ENCOUNTER — Other Ambulatory Visit

## 2023-12-19 DIAGNOSIS — Z419 Encounter for procedure for purposes other than remedying health state, unspecified: Secondary | ICD-10-CM | POA: Diagnosis not present

## 2023-12-22 ENCOUNTER — Ambulatory Visit: Admitting: Internal Medicine

## 2024-01-19 DIAGNOSIS — Z419 Encounter for procedure for purposes other than remedying health state, unspecified: Secondary | ICD-10-CM | POA: Diagnosis not present

## 2024-02-05 ENCOUNTER — Encounter: Payer: Self-pay | Admitting: Internal Medicine

## 2024-02-06 ENCOUNTER — Ambulatory Visit
Admission: RE | Admit: 2024-02-06 | Discharge: 2024-02-06 | Disposition: A | Discharge status: Home / Self Care | Source: Ambulatory Visit | Attending: Family Medicine | Admitting: Family Medicine

## 2024-02-06 ENCOUNTER — Other Ambulatory Visit: Payer: Self-pay

## 2024-02-06 VITALS — BP 139/84 | HR 92 | Temp 99.1°F | Resp 18

## 2024-02-06 DIAGNOSIS — J0101 Acute recurrent maxillary sinusitis: Secondary | ICD-10-CM | POA: Diagnosis not present

## 2024-02-06 MED ORDER — VALSARTAN 160 MG PO TABS: 160.0000 mg | ORAL_TABLET | Freq: Every day | ORAL | 2 refills | Status: AC

## 2024-02-06 MED ORDER — AZELASTINE HCL 0.1 % NA SOLN
1.0000 | Freq: Two times a day (BID) | NASAL | 0 refills | Status: AC
Start: 2024-02-06 — End: ?

## 2024-02-06 MED ORDER — OZEMPIC (0.25 OR 0.5 MG/DOSE) 2 MG/3ML ~~LOC~~ SOPN: 0.5000 mg | PEN_INJECTOR | SUBCUTANEOUS | 3 refills | Status: AC

## 2024-02-06 MED ORDER — AMLODIPINE BESYLATE 5 MG PO TABS: 5.0000 mg | ORAL_TABLET | Freq: Two times a day (BID) | ORAL | 2 refills | Status: AC

## 2024-02-06 MED ORDER — AMOXICILLIN-POT CLAVULANATE 875-125 MG PO TABS
1.0000 | ORAL_TABLET | Freq: Two times a day (BID) | ORAL | 0 refills | Status: AC
Start: 2024-02-06 — End: ?

## 2024-02-06 MED ORDER — FLUCONAZOLE 150 MG PO TABS
150.0000 mg | ORAL_TABLET | Freq: Every day | ORAL | 0 refills | Status: AC
Start: 2024-02-06 — End: ?

## 2024-02-06 MED ORDER — ONDANSETRON 8 MG PO TBDP: 8.0000 mg | ORAL_TABLET | Freq: Three times a day (TID) | ORAL | 2 refills | Status: AC | PRN

## 2024-02-06 NOTE — ED Provider Notes (Signed)
 [facility]    CSN: [id number] Arrival date & time: 02/06/24  1441      History   Chief Complaint No chief complaint on file.   HPI Marjon DESIRAE MANCUSI is a 58 y.o. female presents for sinus congestion.  Patient reports 2 weeks of sinus congestion/pressure with postnasal drip and headache.  Reports irritated throat but not sore throat.  No fevers, cough, body aches, shortness of breath.  No asthma or smoking history.  Does have a history of recurrent sinus infections.  She has been doing her allergy medicine daily.  No other concerns at this time  HPI  Past Medical History:  Diagnosis Date   'Light-for-dates' infant with signs of fetal malnutrition 11/01/2016   Acne    adult   Anxiety    Arthritis 07/23/2019   Chest pain 07/23/2019   Elevated BP    no dx of HTN   Fibroids    History of prolonged Q-T interval on ECG 07/23/2019   HTN (hypertension), benign 10/18/2011   Iron deficiency anemia 10/18/2011   Hb 9.1 MCV 65.8 08/24/11 hx menorrhagia/fibroids/nl GYN exam   Menorrhagia with regular cycle 10/18/2011   Obesity    Obesity, Class III, BMI 40-49.9 (morbid obesity) 07/12/2016   Shortness of breath 07/23/2019   Sinus tachycardia     Patient Active Problem List   Diagnosis Date Noted   Hypercalcemia 12/04/2023   Morbid obesity (HCC) 12/01/2023   Type 2 diabetes mellitus with hyperglycemia, with long-term current use of insulin  (HCC) 09/15/2022   Adrenal insufficiency 09/15/2022   Acute recurrent sinusitis 09/15/2022   Type 2 diabetes mellitus without complication, without long-term current use of insulin  (HCC) 06/07/2022   Shortness of breath 07/23/2019   Arthritis 07/23/2019   History of prolonged Q-T interval on ECG 07/23/2019   Chest pain 07/23/2019   'Light-for-dates' infant with signs of fetal malnutrition 11/01/2016   Obesity, Class III, BMI 40-49.9 (morbid obesity) 07/12/2016   Iron deficiency anemia 10/18/2011   HTN (hypertension), benign 10/18/2011    Menorrhagia with regular cycle 10/18/2011    Past Surgical History:  Procedure Laterality Date   APPENDECTOMY     MYOMECTOMY     OVARIAN CYST REMOVAL     ROOT CANAL  2018    OB History   No obstetric history on file.      Home Medications    Prior to Admission medications   Medication Sig Start Date End Date Taking? Authorizing Provider  amoxicillin -clavulanate (AUGMENTIN ) 875-125 MG tablet Take 1 tablet by mouth every 12 (twelve) hours. 02/06/24  Yes Ciaira Natividad, Jodi R, NP  azelastine  (ASTELIN ) 0.1 % nasal spray Place 1 spray into both nostrils 2 (two) times daily. Use in each nostril as directed 02/06/24  Yes Pamala Hayman, Jodi R, NP  fluconazole  (DIFLUCAN ) 150 MG tablet Take 1 tablet (150 mg total) by mouth daily. 02/06/24  Yes Janesa Dockery, Jodi R, NP  amLODipine  (NORVASC ) 5 MG tablet Take 1 tablet (5 mg total) by mouth in the morning and at bedtime. 02/06/24   Shamleffer, Ibtehal Jaralla, MD  blood glucose meter kit and supplies KIT Dispense based on patient and insurance preference. Use up to four times daily as directed. Patient not taking: Reported on 12/01/2023 11/17/21   Phebe Fonda RAMAN, MD  Continuous Glucose Sensor (DEXCOM G7 SENSOR) MISC 1 Device by Does not apply route as directed. Every 10 days 12/01/23   Shamleffer, Ibtehal Jaralla, MD  escitalopram (LEXAPRO) 10 MG tablet Take 10 mg by mouth  daily. Patient not taking: Reported on 12/01/2023 09/01/22   [provider]  hydrocortisone  (CORTEF ) 10 MG tablet Take 1 tablet (10 mg total) by mouth as directed. 2 tabs every morning and 1 tablet in the evening 12/01/23   Shamleffer, Ibtehal Jaralla, MD  insulin  glargine (LANTUS  SOLOSTAR) 100 UNIT/ML Solostar Pen Inject 45 Units into the skin daily. 12/01/23   Shamleffer, Ibtehal Jaralla, MD  insulin  lispro (HUMALOG  KWIKPEN) 100 UNIT/ML KwikPen Max daily 60 units 12/01/23   Shamleffer, Ibtehal Jaralla, MD  Insulin  Pen Needle 31G X 8 MM MISC 1 Device by Does not apply route in the morning, at noon, in  the evening, and at bedtime. 12/01/23   Shamleffer, Ibtehal Jaralla, MD  levocetirizine (XYZAL) 5 MG tablet Take 5 mg by mouth every evening. 08/07/19   [provider]  ondansetron  (ZOFRAN -ODT) 8 MG disintegrating tablet Take 1 tablet (8 mg total) by mouth every 8 (eight) hours as needed for nausea or vomiting. 02/06/24   Shamleffer, Ibtehal Jaralla, MD  rosuvastatin (CRESTOR) 10 MG tablet Take 10 mg by mouth at bedtime. Patient not taking: Reported on 12/01/2023 07/12/22   [provider]  Semaglutide ,0.25 or 0.5MG /DOS, (OZEMPIC , 0.25 OR 0.5 MG/DOSE,) 2 MG/3ML SOPN Inject 0.5 mg into the skin once a week. 02/06/24   Shamleffer, Ibtehal Jaralla, MD  tizanidine  (ZANAFLEX ) 2 MG capsule Take 2 mg by mouth 3 (three) times daily.    [provider]  valsartan  (DIOVAN ) 160 MG tablet Take 1 tablet (160 mg total) by mouth daily. 02/06/24   Shamleffer, Ibtehal Jaralla, MD  venlafaxine  XR (EFFEXOR -XR) 150 MG 24 hr capsule Take 1 capsule (150 mg total) by mouth daily. Patient not taking: Reported on 12/01/2023 11/23/20   Vincente Grip, MD  fluticasone  (FLONASE ) 50 MCG/ACT nasal spray Place 1 spray into both nostrils daily. 07/01/18 06/11/19  Blaise Aleene KIDD, MD  loratadine (CLARITIN) 10 MG tablet Take 10 mg by mouth as needed for allergies.   02/23/19  [provider]  sodium chloride  (OCEAN) 0.65 % SOLN nasal spray Place 1 spray into both nostrils as needed for congestion. Patient not taking: Reported on 09/21/2018 07/01/18 02/23/19  Lamptey, Aleene KIDD, MD    Family History Family History  Problem Relation Age of Onset   Osteoarthritis Mother    COPD Mother    Diabetes Mother    Hypertension Mother    Cancer - Lung Mother    Cancer - Colon Mother    Cancer Mother        breast    Osteoarthritis Father    Diabetes Father    Diabetes Brother    Hypertension Brother    Stroke Brother    Hypertension Brother    Raynaud syndrome Daughter     Social History Social History    Tobacco Use   Smoking status: Never   Smokeless tobacco: Never  Vaping Use   Vaping status: Never Used  Substance Use Topics   Alcohol use: No   Drug use: No     Allergies   Metformin  and related and Oseltamivir   Review of Systems Review of Systems  HENT:  Positive for congestion, postnasal drip, sinus pressure and sinus pain.   Neurological:  Positive for headaches.     Physical Exam Triage Vital Signs ED Triage Vitals  Encounter Vitals Group     BP 02/06/24 1517 139/84     Girls Systolic BP Percentile --      Girls Diastolic BP Percentile --  Boys Systolic BP Percentile --      Boys Diastolic BP Percentile --      Pulse Rate 02/06/24 1517 92     Resp 02/06/24 1517 18     Temp 02/06/24 1517 99.1 F (37.3 C)     Temp Source 02/06/24 1517 Oral     SpO2 02/06/24 1517 97 %     Weight --      Height --      Head Circumference --      Peak Flow --      Pain Score 02/06/24 1515 3     Pain Loc --      Pain Education --      Exclude from Growth Chart --    No data found.  Updated Vital Signs BP 139/84   Pulse 92   Temp 99.1 F (37.3 C) (Oral)   Resp 18   SpO2 97%   Visual Acuity Right Eye Distance:   Left Eye Distance:   Bilateral Distance:    Right Eye Near:   Left Eye Near:    Bilateral Near:     Physical Exam Vitals and nursing note reviewed.  Constitutional:      General: She is not in acute distress.    Appearance: She is well-developed. She is not ill-appearing.  HENT:     Head: Normocephalic and atraumatic.     Right Ear: Tympanic membrane and ear canal normal.     Left Ear: Tympanic membrane and ear canal normal.     Nose: Congestion present.     Right Turbinates: Swollen and pale.     Left Turbinates: Swollen and pale.     Right Sinus: Maxillary sinus tenderness and frontal sinus tenderness present.     Left Sinus: No maxillary sinus tenderness or frontal sinus tenderness.     Mouth/Throat:     Mouth: Mucous membranes are  moist.     Pharynx: Oropharynx is clear. Uvula midline. No oropharyngeal exudate or posterior oropharyngeal erythema.     Tonsils: No tonsillar exudate or tonsillar abscesses.  Eyes:     Conjunctiva/sclera: Conjunctivae normal.     Pupils: Pupils are equal, round, and reactive to light.  Cardiovascular:     Rate and Rhythm: Normal rate and regular rhythm.     Heart sounds: Normal heart sounds.  Pulmonary:     Effort: Pulmonary effort is normal.     Breath sounds: Normal breath sounds.  Musculoskeletal:     Cervical back: Normal range of motion and neck supple.  Lymphadenopathy:     Cervical: No cervical adenopathy.  Skin:    General: Skin is warm and dry.  Neurological:     General: No focal deficit present.     Mental Status: She is alert and oriented to person, place, and time.  Psychiatric:        Mood and Affect: Mood normal.        Behavior: Behavior normal.      UC Treatments / Results  Labs (all labs ordered are listed, but only abnormal results are displayed) Labs Reviewed - No data to display  Basic metabolic panel with GFR Order: [id number]  Status: Final result     Next appt: 03/08/2024 at 02:00 PM in Endocrinology Cricket JINNY Butts, MD)     Dx: Type 2 diabetes mellitus with hypergl...   Test Result Released: Yes (seen)     Messages: Seen   0 Result Notes     1 Patient  Communication     View Follow-Up Encounter     1 HM Topic          Component Ref Range & Units (hover) 2 mo ago (12/01/23) 1 yr ago (12/30/22) 1 yr ago (09/15/22) 2 yr ago (11/17/21) 2 yr ago (11/17/21) 4 yr ago (06/11/19) 6 yr ago (10/28/17)  Glucose, Bld 232 High  181 High  R, CM 153 High  R 395 High  R, CM  93 CM 133 High   Comment: .            Fasting reference interval . For someone without known diabetes, a glucose value >125 mg/dL indicates that they may have diabetes and this should be confirmed with a follow-up test. .  BUN 10 9 R 10 R 9 R  9 11 R  Creat 0.90 1.16  High  R 1.12 R 1.17 High  R  1.05 R, CM 1.05 High  R  eGFR 75        BUN/Creatinine Ratio SEE NOTE:     NOT APPLICABLE   Comment:    Not Reported: BUN and Creatinine are within    reference range. .  Sodium 139 139 R 141 R 133 Low  R 135 R 142 139 R  Potassium 4.0 3.9 R 4.2 R 4.3 R 4.4 R 3.9 3.7 R  Chloride 103 104 R 104 R 100 R  106 107 R  CO2 26 27 R 23 R 25 R  27 24 R  Calcium 10.9 High  10.9 High  R 10.6 High  R 10.0 R  9.8 10.5 High  R  Resulting Agency [facility]         Resulting Agency's Comment  Performing Organization Information:     Site [facility]ENN[facility] (CLIA: [id number])     Name: ORLIN SUMNER MORITA     Address: [address] [place], [place] [address]     Director: ROMERO RAV MD  Specimen Collected: 12/01/23 14:11 Last Resulted: 12/04/23 15:05    EKG   Radiology No results found.  Procedures Procedures (including critical care time)  Medications Ordered in UC Medications - No data to display  Initial Impression / Assessment and Plan / UC Course  I have reviewed the triage vital signs and the nursing notes.  Pertinent labs & imaging results that were available during my care of the patient were reviewed by me and considered in my medical decision making (see chart for details).     Reviewed exam and symptoms with patient.  No red flags.  Will treat sinus infection with Augmentin .  Patient requested refill of azelastine .  Also requested Diflucan  for history of antibiotic induced yeast infections.  Nasal rinses as tolerated.  Advised PCP follow-up if symptoms do not improve.  ER precautions reviewed. Final Clinical Impressions(s) / UC Diagnoses   Final diagnoses:  Acute recurrent maxillary sinusitis     Discharge Instructions      Start Augmentin  twice daily for 7 days.  May use azelastine  nasal spray as prescribed.  Take Diflucan  to prevent antibiotic  induced yeast infections.  Nasal rinses as tolerated.  Lots of rest and fluids.  Please follow-up with your PCP if your symptoms do not improve.  Please go to the ER for any worsening symptoms.  I hope you feel better soon!    ED Prescriptions     Medication Sig Dispense Auth.  Provider   amoxicillin -clavulanate (AUGMENTIN ) 875-125 MG tablet Take 1 tablet by mouth every 12 (twelve) hours. 14 tablet Aiyana Stegmann, Jodi R, NP   azelastine  (ASTELIN ) 0.1 % nasal spray Place 1 spray into both nostrils 2 (two) times daily. Use in each nostril as directed 30 mL Lamberto Dinapoli, Jodi R, NP   fluconazole  (DIFLUCAN ) 150 MG tablet Take 1 tablet (150 mg total) by mouth daily. 1 tablet Jahlisa Rossitto, Jodi R, NP      PDMP not reviewed this encounter.   Loreda Myla SAUNDERS, NP 02/06/24 [phone]

## 2024-02-06 NOTE — ED Triage Notes (Signed)
 Pt c/o nasal congestion, sinus HA, nasal drainagexmo

## 2024-02-06 NOTE — Discharge Instructions (Addendum)
 Start Augmentin  twice daily for 7 days.  May use azelastine  nasal spray as prescribed.  Take Diflucan  to prevent antibiotic induced yeast infections.  Nasal rinses as tolerated.  Lots of rest and fluids.  Please follow-up with your PCP if your symptoms do not improve.  Please go to the ER for any worsening symptoms.  I hope you feel better soon!

## 2024-02-12 ENCOUNTER — Other Ambulatory Visit (HOSPITAL_COMMUNITY): Payer: Self-pay

## 2024-02-13 ENCOUNTER — Other Ambulatory Visit (HOSPITAL_COMMUNITY): Payer: Self-pay

## 2024-02-13 ENCOUNTER — Telehealth: Payer: Self-pay

## 2024-02-13 NOTE — Telephone Encounter (Signed)
 Pharmacy Patient Advocate Encounter   Received notification from CoverMyMeds that prior authorization for Dexcom G7 sensor is required/requested.   Insurance verification completed.   The patient is insured through [facility].   Per test claim: PA required; PA submitted to above mentioned insurance via Latent Key/confirmation #/EOC [id number] Status is pending

## 2024-02-18 DIAGNOSIS — Z419 Encounter for procedure for purposes other than remedying health state, unspecified: Secondary | ICD-10-CM | POA: Diagnosis not present

## 2024-02-27 NOTE — Telephone Encounter (Signed)
 Pharmacy Patient Advocate Encounter  Received notification from [facility] that Prior Authorization for Dexcom G7 sensor  has been APPROVED from 1007-2025 to 08-11-2024   PA #/Case ID/Reference #: [id number]

## 2024-03-08 ENCOUNTER — Ambulatory Visit: Admitting: Internal Medicine

## 2024-03-08 MED ORDER — DEXCOM G7 SENSOR MISC: 1.0000 | 3 refills | Status: AC

## 2024-03-08 NOTE — Addendum Note (Signed)
 Addended by: CLEOTILDE ROLIN RAMAN on: 03/08/2024 11:05 AM   Modules accepted: Orders

## 2024-04-07 ENCOUNTER — Other Ambulatory Visit: Payer: Self-pay | Admitting: Internal Medicine

## 2024-04-08 MED ORDER — TRESIBA FLEXTOUCH 100 UNIT/ML ~~LOC~~ SOPN: 30.0000 [IU] | PEN_INJECTOR | Freq: Every day | SUBCUTANEOUS | 3 refills | Status: AC

## 2024-04-16 ENCOUNTER — Ambulatory Visit: Admitting: Internal Medicine

## 2024-04-18 ENCOUNTER — Ambulatory Visit: Admitting: Internal Medicine

## 2024-04-18 NOTE — Progress Notes (Deleted)
 Name: Debra Mathews  MRN/ DOB: [id number], 1965-08-22   Age/ Sex: 58 y.o., female    PCP: Dyane Anthony RAMAN, FNP   Reason for Endocrinology Evaluation: Type 2 Diabetes Mellitus     Date of Initial Endocrinology Visit: 06/07/2022    PATIENT IDENTIFIER: Debra Mathews is a 58 y.o. female with a past medical history of DM, HTN, OSA on CPAP ,anemia . The patient presented for initial endocrinology clinic visit on 06/07/2022 for consultative assistance with her diabetes management.    HPI: Debra Mathews was    Diagnosed with DM 11/2021, when she presented to urgent care with symptomatic hyperglycemia was noted with a BG 395 mg/dL and J8r of 0.6% Prior Medications tried/Intolerance: On her initial diagnosis she was started on metformin  and glipizide  but she developed explosive diarrhea to metformin  , she was subsequently started on insulin  but due to hypoglycemia she stopped it around 11/ 2023      Hemoglobin A1c has ranged from 6.1% in 2023, peaking at 9.3% in 2023.  On her initial visit to our clinic her A1c was 6.1%, she was not on any glycemic agents  Unfortunately, by her return 09/2022 she had already restarted basal/prandial insulin  due to hyperglycemia in the 300s mg/DL  ADRENAL HISTORY: Patient had concerns related to adrenal insufficiency, she has rheumatoid arthritis and takes glucocorticoids intermittently for flareups.   She was having extreme fatigue, dizziness as well as hyperhidrosis with minimal activity as well as hypoglycemia with no glycemic agents  Cosyntropin  stimulation test was abnormal at 17.6 ug/dL at 60 minutes She was started on hydrocortisone  by 06/2022  Pt with OSA  on CPAP   SUBJECTIVE:   During the last visit (12/01/2023): A1c 9.6%    Today (04/18/2024): Debra Mathews is here for a follow up on Diabetes management and adrenal insufficiency.  She checks glucose multiple times daily through Dexcom.    Not consistent with Humalog  every meal Denies nausea, or  vomiting  Had recent diarrhea    HOME ENDOCRINE REGIMEN: HC 10 mg 2 tabs QAM and 1 tab QPM Lantus  44 units daily Humalog  12-14 units TIDQAC CF: Humalog  (BG -130/30)     Statin: no ACE-I/ARB: yes  CONTINUOUS GLUCOSE MONITORING RECORD INTERPRETATION    Dates of Recording:   Sensor description:  Results statistics:   CGM use % of time   Average and SD   Time in range        %  % Time Above 180   % Time above 250   % Time Below target    Glycemic patterns summary:   Hyperglycemic episodes    Hypoglycemic episodes occurred  Overnight periods:      DIABETIC COMPLICATIONS: Microvascular complications:  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  Denies: CKD  Last eye exam: Completed 2023  Macrovascular complications:   Denies: CAD, PVD, CVA   PAST HISTORY: Past Medical History:  Past Medical History:  Diagnosis Date   'Light-for-dates' infant with signs of fetal malnutrition 11/01/2016   Acne    adult   Anxiety    Arthritis 07/23/2019   Chest pain 07/23/2019   Elevated BP    no dx of HTN   Fibroids    History of prolonged Q-T interval on ECG 07/23/2019   HTN (hypertension), benign 10/18/2011   Iron deficiency anemia 10/18/2011   Hb 9.1 MCV 65.8 08/24/11 hx menorrhagia/fibroids/nl GYN exam   Menorrhagia with regular cycle 10/18/2011   Obesity  Obesity, Class III, BMI 40-49.9 (morbid obesity) 07/12/2016   Shortness of breath 07/23/2019   Sinus tachycardia    Past Surgical History:  Past Surgical History:  Procedure Laterality Date   APPENDECTOMY     MYOMECTOMY     OVARIAN CYST REMOVAL     ROOT CANAL  2018    Social History:  reports that she has never smoked. She has never used smokeless tobacco. She reports that she does not drink alcohol and does not use drugs. Family History:  Family History  Problem Relation Age of Onset   Osteoarthritis Mother    COPD Mother    Diabetes Mother    Hypertension Mother    Cancer - Lung Mother     Cancer - Colon Mother    Cancer Mother        breast    Osteoarthritis Father    Diabetes Father    Diabetes Brother    Hypertension Brother    Stroke Brother    Hypertension Brother    Raynaud syndrome Daughter      HOME MEDICATIONS: Allergies as of 04/18/2024       Reactions   Metformin  And Related Diarrhea   Oseltamivir Diarrhea, Nausea And Vomiting        Medication List        Accurate as of April 18, 2024  7:17 AM. If you have any questions, ask your nurse or doctor.          amLODipine  5 MG tablet Commonly known as: NORVASC  Take 1 tablet (5 mg total) by mouth in the morning and at bedtime.   amoxicillin -clavulanate 875-125 MG tablet Commonly known as: AUGMENTIN  Take 1 tablet by mouth every 12 (twelve) hours.   azelastine  0.1 % nasal spray Commonly known as: ASTELIN  Place 1 spray into both nostrils 2 (two) times daily. Use in each nostril as directed   blood glucose meter kit and supplies Kit Dispense based on patient and insurance preference. Use up to four times daily as directed.   Dexcom G7 Sensor Misc 1 Device by Does not apply route as directed. Every 10 days   escitalopram 10 MG tablet Commonly known as: LEXAPRO Take 10 mg by mouth daily.   fluconazole  150 MG tablet Commonly known as: Diflucan  Take 1 tablet (150 mg total) by mouth daily.   hydrocortisone  10 MG tablet Commonly known as: CORTEF  Take 1 tablet (10 mg total) by mouth as directed. 2 tabs every morning and 1 tablet in the evening   insulin  lispro 100 UNIT/ML KwikPen Commonly known as: HumaLOG  KwikPen Max daily 60 units   Insulin  Pen Needle 31G X 8 MM Misc 1 Device by Does not apply route in the morning, at noon, in the evening, and at bedtime.   Lantus  SoloStar 100 UNIT/ML Solostar Pen Generic drug: insulin  glargine Inject 45 Units into the skin daily.   levocetirizine 5 MG tablet Commonly known as: XYZAL Take 5 mg by mouth every evening.   ondansetron  8 MG  disintegrating tablet Commonly known as: ZOFRAN -ODT Take 1 tablet (8 mg total) by mouth every 8 (eight) hours as needed for nausea or vomiting.   Ozempic  (0.25 or 0.5 MG/DOSE) 2 MG/3ML Sopn Generic drug: Semaglutide (0.25 or 0.5MG /DOS) Inject 0.5 mg into the skin once a week.   rosuvastatin 10 MG tablet Commonly known as: CRESTOR Take 10 mg by mouth at bedtime.   tizanidine  2 MG capsule Commonly known as: ZANAFLEX  Take 2 mg by mouth 3 (three) times daily.  Tresiba  FlexTouch 100 UNIT/ML FlexTouch Pen Generic drug: insulin  degludec Inject 30 Units into the skin daily.   valsartan  160 MG tablet Commonly known as: DIOVAN  Take 1 tablet (160 mg total) by mouth daily.   venlafaxine  XR 150 MG 24 hr capsule Commonly known as: EFFEXOR -XR Take 1 capsule (150 mg total) by mouth daily.         ALLERGIES: Allergies  Allergen Reactions   Metformin  And Related Diarrhea   Oseltamivir Diarrhea and Nausea And Vomiting     REVIEW OF SYSTEMS: A comprehensive ROS was conducted with the patient and is negative except as per HPI     OBJECTIVE:   VITAL SIGNS: There were no vitals taken for this visit.     There is no height or weight on file to calculate BSA.  PHYSICAL EXAM:  General: Pt appears well and is in NAD  Lungs: Clear with good BS bilat with no rales, rhonchi, or wheezes  Heart: RRR   Abdomen:  soft, nontender  Extremities:  Lower extremities - No pretibial edema.   Neuro: MS is good with appropriate affect, pt is alert and Ox3    DM foot exam: 12/01/2023  The skin of the feet is intact without sores or ulcerations. The pedal pulses are 2+ on right and 2+ on left. The sensation is intact to a screening 5.07, 10 gram monofilament bilaterally   DATA REVIEWED:   Latest Reference Range & Units 12/01/23 14:11  Sodium 135 - 146 mmol/L 139  Potassium 3.5 - 5.3 mmol/L 4.0  Chloride 98 - 110 mmol/L 103  CO2 20 - 32 mmol/L 26  Glucose 65 - 99 mg/dL 767 (H)  BUN 7 -  25 mg/dL 10  Creatinine 9.49 - 8.96 mg/dL 9.09  Calcium 8.6 - 89.5 mg/dL 89.0 (H)  BUN/Creatinine Ratio 6 - 22 (calc) SEE NOTE:  eGFR > OR = 60 mL/min/1.59m2 75  Total CHOL/HDL Ratio <5.0 (calc) 4.2  Cholesterol <200 mg/dL 797 (H)  HDL Cholesterol > OR = 50 mg/dL 48 (L)  LDL Cholesterol (Calc) mg/dL (calc) 866 (H)  Non-HDL Cholesterol (Calc) <130 mg/dL (calc) 845 (H)  Triglycerides <150 mg/dL 892    Latest Reference Range & Units 12/01/23 14:11  TSH 0.40 - 4.50 mIU/L 1.86     Latest Reference Range & Units 12/01/23 14:27  Microalb, Ur mg/dL 2.1  MICROALB/CREAT RATIO <30 mg/g creat 18  Creatinine, Urine 20 - 275 mg/dL 885      ASSESSMENT / PLAN / RECOMMENDATIONS:   1) Type 2 Diabetes Mellitus, Poorly  controlled, Without complications - Most recent A1c of 9.6%. Goal A1c < 7.0 %.    -Patient continues with poor glycemic control -She has not been checking glucose, thus missing out on correction doses of Humalog  -She is intolerant to metformin  - A prescription for Dexcom was sent to the pharmacy - Historically she has not been able to obtain GLP-1 agonist due to insurance coverage, will prescribe Ozempic , patient understands GI side effects   MEDICATIONS: Lantus  44  units daily Continue Humalog  12-14 units 3 times daily before every meal CF: Humalog  (BG -130/30) 3 times daily before every meal Ozempic  0.25 mg weekly for 6 weeks then increase to 0.5 mg weekly  EDUCATION / INSTRUCTIONS: BG monitoring instructions: Patient is instructed to check her blood sugars 3 times a day. Call [facility] if: BG persistently < 70  I reviewed the Rule of 15 for the treatment of hypoglycemia in detail with the patient.  Literature supplied.   2) Diabetic complications:  Eye: Does not have known diabetic retinopathy.  Neuro/ Feet: Does not have known diabetic peripheral neuropathy. Renal: Patient does not have known baseline CKD. She is  on an ACEI/ARB at present.  3)  Adrenal insufficiency:  -Patient had an abnormal cosyntropin  stimulation test 06/2022 and was started on hydrocortisone  -Discussed sick day will by doubling up with severe illness for 2-3 days during the acute phase and returning to regular dose after that - She tends to forget her afternoon dose, discussed the importance of compliance - ACTH  pending today - No recent glucose records injections or oral other than hydrocortisone   Medication Continue hydrocortisone  10 mg, 2 tabs with breakfast and 1 tab between 2-4 PM   4)Dyslipidemia:  - Historically she has had LDL above goal - She is not on rosuvastatin, she is not sure why - LDL above goal, patient will be encouraged to restart statin therapy   Medication Rosuvastatin 10 mg daily  5) Morbid obesity  -A referral to surgery has been placed   6) Hypercalcemia:  - Serum calcium slightly elevated, will need further workup - In the meantime will encourage hydration, avoiding OTC calcium tablets, consuming 2-3 servings of dietary calcium daily  Follow-up 3 months   Signed electronically by: Stefano Redgie Butts, MD  [facility]  [facility] [address] [place] [place], [place] [address] Phone: [phone] FAX: [phone]   CC: Dyane Anthony RAMAN, FNP [address] [place] [address] Phone: [phone]  Fax: [phone]    Return to Endocrinology clinic as below: Future Appointments  Date Time Provider Department Center  04/18/2024 10:10 AM Tynia Wiers, Donell Redgie, MD [id number] None
